# Patient Record
Sex: Female | Born: 1970 | Race: Black or African American | Hispanic: No | Marital: Single | Smoking: Never smoker
Health system: Southern US, Community
[De-identification: ages and names within clinical notes are randomized; demographics above are authoritative.]

## PROBLEM LIST (undated history)

## (undated) DIAGNOSIS — C801 Malignant (primary) neoplasm, unspecified: Secondary | ICD-10-CM

## (undated) DIAGNOSIS — Z8042 Family history of malignant neoplasm of prostate: Secondary | ICD-10-CM

## (undated) DIAGNOSIS — Z923 Personal history of irradiation: Secondary | ICD-10-CM

## (undated) DIAGNOSIS — Z800 Family history of malignant neoplasm of digestive organs: Secondary | ICD-10-CM

## (undated) DIAGNOSIS — Z803 Family history of malignant neoplasm of breast: Secondary | ICD-10-CM

## (undated) HISTORY — PX: OTHER SURGICAL HISTORY: SHX169

## (undated) HISTORY — DX: Family history of malignant neoplasm of prostate: Z80.42

## (undated) HISTORY — DX: Family history of malignant neoplasm of breast: Z80.3

## (undated) HISTORY — DX: Family history of malignant neoplasm of digestive organs: Z80.0

---

## 2006-08-26 ENCOUNTER — Encounter (INDEPENDENT_AMBULATORY_CARE_PROVIDER_SITE_OTHER): Payer: Self-pay | Admitting: Nurse Practitioner

## 2006-08-26 ENCOUNTER — Other Ambulatory Visit: Admission: RE | Admit: 2006-08-26 | Discharge: 2006-08-26 | Payer: Self-pay | Admitting: Family Medicine

## 2006-08-26 ENCOUNTER — Ambulatory Visit: Payer: Self-pay | Admitting: Internal Medicine

## 2006-08-26 LAB — CONVERTED CEMR LAB
ALT: 13 units/L (ref 0–35)
Bilirubin Urine: NEGATIVE
CO2: 21 meq/L (ref 19–32)
Calcium: 9.2 mg/dL (ref 8.4–10.5)
Chloride: 105 meq/L (ref 96–112)
Cholesterol: 167 mg/dL (ref 0–200)
Eosinophils Absolute: 0 10*3/uL (ref 0.0–0.7)
Eosinophils Relative: 1 % (ref 0–5)
GC Probe Amp, Genital: NEGATIVE
Glucose, Bld: 84 mg/dL (ref 70–99)
HCT: 39.9 % (ref 36.0–46.0)
HDL: 46 mg/dL (ref >39)
HIV-1 Genotyping, DNA Sequencing: NEGATIVE
Hemoglobin: 12.8 g/dL (ref 12.0–15.0)
Ketones, ur: NEGATIVE mg/dL
MCHC: 32.1 g/dL (ref 30.0–36.0)
MCV: 92.8 fL (ref 78.0–100.0)
Monocytes Absolute: 0.3 10*3/uL (ref 0.2–0.7)
Monocytes Relative: 5 % (ref 3–11)
Pap Smear: NEGATIVE
Platelets: 262 10*3/uL (ref 150–400)
Protein, ur: NEGATIVE mg/dL
RBC: 4.3 M/uL (ref 3.87–5.11)
RDW: 13.2 % (ref 11.5–14.0)
TSH: 1.672 microintl units/mL
TSH: 1.672 microintl units/mL (ref 0.350–5.50)
Total Bilirubin: 0.6 mg/dL (ref 0.3–1.2)
Total Protein: 7.6 g/dL (ref 6.0–8.3)
Urine Glucose: NEGATIVE mg/dL
VLDL: 13 mg/dL (ref 0–40)
WBC: 6 10*3/uL (ref 4.0–10.5)
pH: 6

## 2006-08-30 ENCOUNTER — Encounter: Payer: Self-pay | Admitting: Nurse Practitioner

## 2006-08-30 DIAGNOSIS — R630 Anorexia: Secondary | ICD-10-CM

## 2006-12-30 ENCOUNTER — Telehealth (INDEPENDENT_AMBULATORY_CARE_PROVIDER_SITE_OTHER): Payer: Self-pay | Admitting: *Deleted

## 2007-01-23 ENCOUNTER — Telehealth (INDEPENDENT_AMBULATORY_CARE_PROVIDER_SITE_OTHER): Payer: Self-pay | Admitting: Internal Medicine

## 2007-01-23 ENCOUNTER — Telehealth (INDEPENDENT_AMBULATORY_CARE_PROVIDER_SITE_OTHER): Payer: Self-pay | Admitting: Nurse Practitioner

## 2007-02-16 ENCOUNTER — Telehealth (INDEPENDENT_AMBULATORY_CARE_PROVIDER_SITE_OTHER): Payer: Self-pay | Admitting: Nurse Practitioner

## 2007-02-19 ENCOUNTER — Telehealth (INDEPENDENT_AMBULATORY_CARE_PROVIDER_SITE_OTHER): Payer: Self-pay | Admitting: *Deleted

## 2007-03-02 ENCOUNTER — Ambulatory Visit: Payer: Self-pay | Admitting: Nurse Practitioner

## 2007-03-02 DIAGNOSIS — B3731 Acute candidiasis of vulva and vagina: Secondary | ICD-10-CM | POA: Insufficient documentation

## 2007-03-02 DIAGNOSIS — N898 Other specified noninflammatory disorders of vagina: Secondary | ICD-10-CM | POA: Insufficient documentation

## 2007-03-02 DIAGNOSIS — B373 Candidiasis of vulva and vagina: Secondary | ICD-10-CM

## 2007-03-02 LAB — CONVERTED CEMR LAB
Chlamydia, Swab/Urine, PCR: NEGATIVE
GC Probe Amp, Urine: NEGATIVE
Glucose, Urine, Semiquant: NEGATIVE
Urobilinogen, UA: 1
WBC Urine, dipstick: NEGATIVE
pH: 6

## 2007-03-03 ENCOUNTER — Encounter (INDEPENDENT_AMBULATORY_CARE_PROVIDER_SITE_OTHER): Payer: Self-pay | Admitting: Nurse Practitioner

## 2007-03-04 ENCOUNTER — Encounter (INDEPENDENT_AMBULATORY_CARE_PROVIDER_SITE_OTHER): Payer: Self-pay | Admitting: Nurse Practitioner

## 2007-03-16 ENCOUNTER — Telehealth (INDEPENDENT_AMBULATORY_CARE_PROVIDER_SITE_OTHER): Payer: Self-pay | Admitting: *Deleted

## 2007-03-18 ENCOUNTER — Ambulatory Visit: Payer: Self-pay | Admitting: Nurse Practitioner

## 2007-03-19 ENCOUNTER — Telehealth (INDEPENDENT_AMBULATORY_CARE_PROVIDER_SITE_OTHER): Payer: Self-pay | Admitting: Nurse Practitioner

## 2007-05-19 ENCOUNTER — Ambulatory Visit: Payer: Self-pay | Admitting: Internal Medicine

## 2007-05-19 DIAGNOSIS — R609 Edema, unspecified: Secondary | ICD-10-CM

## 2007-05-19 LAB — CONVERTED CEMR LAB
Beta hcg, urine, semiquantitative: NEGATIVE
Specific Gravity, Urine: 1.02
Urobilinogen, UA: 1
WBC Urine, dipstick: NEGATIVE

## 2007-05-21 LAB — CONVERTED CEMR LAB
AST: 13 units/L (ref 0–37)
BUN: 10 mg/dL (ref 6–23)
Basophils Absolute: 0 10*3/uL (ref 0.0–0.1)
Basophils Relative: 0 % (ref 0–1)
CO2: 23 meq/L (ref 19–32)
Chloride: 104 meq/L (ref 96–112)
Eosinophils Absolute: 0.1 10*3/uL (ref 0.0–0.7)
Eosinophils Relative: 1 % (ref 0–5)
HCT: 37.1 % (ref 36.0–46.0)
Lymphocytes Relative: 50 % — ABNORMAL HIGH (ref 12–46)
MCHC: 34.2 g/dL (ref 30.0–36.0)
Monocytes Absolute: 0.4 10*3/uL (ref 0.1–1.0)
Platelets: 275 10*3/uL (ref 150–400)
Potassium: 4 meq/L (ref 3.5–5.3)
RDW: 13.2 % (ref 11.5–15.5)
Sodium: 138 meq/L (ref 135–145)
Total Bilirubin: 0.4 mg/dL (ref 0.3–1.2)

## 2007-07-16 ENCOUNTER — Telehealth (INDEPENDENT_AMBULATORY_CARE_PROVIDER_SITE_OTHER): Payer: Self-pay | Admitting: Nurse Practitioner

## 2007-07-21 ENCOUNTER — Ambulatory Visit: Payer: Self-pay | Admitting: Nurse Practitioner

## 2007-07-21 LAB — CONVERTED CEMR LAB
Bilirubin Urine: NEGATIVE
Nitrite: NEGATIVE
Specific Gravity, Urine: >=1.03
Urobilinogen, UA: 1
WBC Urine, dipstick: NEGATIVE

## 2007-09-01 ENCOUNTER — Telehealth (INDEPENDENT_AMBULATORY_CARE_PROVIDER_SITE_OTHER): Payer: Self-pay | Admitting: Nurse Practitioner

## 2007-10-07 ENCOUNTER — Telehealth (INDEPENDENT_AMBULATORY_CARE_PROVIDER_SITE_OTHER): Payer: Self-pay | Admitting: Nurse Practitioner

## 2007-10-22 ENCOUNTER — Ambulatory Visit: Payer: Self-pay | Admitting: Nurse Practitioner

## 2007-10-22 DIAGNOSIS — N644 Mastodynia: Secondary | ICD-10-CM

## 2007-10-22 DIAGNOSIS — R3129 Other microscopic hematuria: Secondary | ICD-10-CM

## 2007-10-22 DIAGNOSIS — F32A Depression, unspecified: Secondary | ICD-10-CM | POA: Insufficient documentation

## 2007-10-22 DIAGNOSIS — F329 Major depressive disorder, single episode, unspecified: Secondary | ICD-10-CM

## 2007-10-22 LAB — CONVERTED CEMR LAB
Bilirubin Urine: NEGATIVE
Protein, U semiquant: NEGATIVE
Specific Gravity, Urine: 1.01
Urobilinogen, UA: 1
WBC Urine, dipstick: NEGATIVE
pH: 6.5

## 2007-10-23 ENCOUNTER — Encounter (INDEPENDENT_AMBULATORY_CARE_PROVIDER_SITE_OTHER): Payer: Self-pay | Admitting: Internal Medicine

## 2007-11-12 ENCOUNTER — Telehealth (INDEPENDENT_AMBULATORY_CARE_PROVIDER_SITE_OTHER): Payer: Self-pay | Admitting: Nurse Practitioner

## 2008-01-01 ENCOUNTER — Telehealth (INDEPENDENT_AMBULATORY_CARE_PROVIDER_SITE_OTHER): Payer: Self-pay | Admitting: Nurse Practitioner

## 2008-01-04 ENCOUNTER — Ambulatory Visit: Payer: Self-pay | Admitting: Nurse Practitioner

## 2008-01-04 DIAGNOSIS — N3000 Acute cystitis without hematuria: Secondary | ICD-10-CM | POA: Insufficient documentation

## 2008-01-04 LAB — CONVERTED CEMR LAB
Bilirubin Urine: NEGATIVE
Nitrite: NEGATIVE
Specific Gravity, Urine: 1.02
Urobilinogen, UA: 0.2

## 2008-01-05 ENCOUNTER — Encounter (INDEPENDENT_AMBULATORY_CARE_PROVIDER_SITE_OTHER): Payer: Self-pay | Admitting: Nurse Practitioner

## 2008-01-13 ENCOUNTER — Emergency Department (HOSPITAL_COMMUNITY): Admission: EM | Admit: 2008-01-13 | Discharge: 2008-01-13 | Payer: Self-pay | Admitting: Emergency Medicine

## 2008-02-11 ENCOUNTER — Ambulatory Visit: Payer: Self-pay | Admitting: Nurse Practitioner

## 2008-02-11 DIAGNOSIS — L659 Nonscarring hair loss, unspecified: Secondary | ICD-10-CM | POA: Insufficient documentation

## 2008-02-15 ENCOUNTER — Encounter (INDEPENDENT_AMBULATORY_CARE_PROVIDER_SITE_OTHER): Payer: Self-pay | Admitting: Nurse Practitioner

## 2008-03-14 ENCOUNTER — Telehealth (INDEPENDENT_AMBULATORY_CARE_PROVIDER_SITE_OTHER): Payer: Self-pay | Admitting: Nurse Practitioner

## 2008-03-16 ENCOUNTER — Ambulatory Visit: Payer: Self-pay | Admitting: Nurse Practitioner

## 2008-03-16 ENCOUNTER — Other Ambulatory Visit: Admission: RE | Admit: 2008-03-16 | Discharge: 2008-03-16 | Payer: Self-pay | Admitting: Internal Medicine

## 2008-03-16 ENCOUNTER — Encounter (INDEPENDENT_AMBULATORY_CARE_PROVIDER_SITE_OTHER): Payer: Self-pay | Admitting: Nurse Practitioner

## 2008-03-16 DIAGNOSIS — M2140 Flat foot [pes planus] (acquired), unspecified foot: Secondary | ICD-10-CM | POA: Insufficient documentation

## 2008-03-16 LAB — CONVERTED CEMR LAB
Albumin: 4.2 g/dL (ref 3.5–5.2)
Alkaline Phosphatase: 50 units/L (ref 39–117)
Basophils Absolute: 0 10*3/uL (ref 0.0–0.1)
Chloride: 105 meq/L (ref 96–112)
Cholesterol: 186 mg/dL (ref 0–200)
Creatinine, Ser: 0.8 mg/dL (ref 0.40–1.20)
Eosinophils Absolute: 0.1 10*3/uL (ref 0.0–0.7)
Eosinophils Relative: 1 % (ref 0–5)
HCT: 37.3 % (ref 36.0–46.0)
Hemoglobin: 13 g/dL (ref 12.0–15.0)
KOH Prep: NEGATIVE
Ketones, urine, test strip: NEGATIVE
LDL Cholesterol: 119 mg/dL — ABNORMAL HIGH (ref 0–99)
Lymphocytes Relative: 43 % (ref 12–46)
Lymphs Abs: 2.6 10*3/uL (ref 0.7–4.0)
MCHC: 34.9 g/dL (ref 30.0–36.0)
MCV: 87.6 fL (ref 78.0–100.0)
Monocytes Relative: 7 % (ref 3–12)
Neutro Abs: 2.9 10*3/uL (ref 1.7–7.7)
Potassium: 4.4 meq/L (ref 3.5–5.3)
Protein, U semiquant: 30
Sodium: 140 meq/L (ref 135–145)
Specific Gravity, Urine: >=1.03
TSH: 4.84 microintl units/mL — ABNORMAL HIGH (ref 0.350–4.50)
Total Protein: 7.1 g/dL (ref 6.0–8.3)
Triglycerides: 76 mg/dL (ref <150)
pH: 5.5

## 2008-03-17 ENCOUNTER — Encounter (INDEPENDENT_AMBULATORY_CARE_PROVIDER_SITE_OTHER): Payer: Self-pay | Admitting: Nurse Practitioner

## 2008-04-08 ENCOUNTER — Telehealth (INDEPENDENT_AMBULATORY_CARE_PROVIDER_SITE_OTHER): Payer: Self-pay | Admitting: Nurse Practitioner

## 2008-05-24 ENCOUNTER — Ambulatory Visit: Payer: Self-pay | Admitting: Internal Medicine

## 2008-05-24 ENCOUNTER — Encounter (INDEPENDENT_AMBULATORY_CARE_PROVIDER_SITE_OTHER): Payer: Self-pay | Admitting: Nurse Practitioner

## 2008-05-24 LAB — CONVERTED CEMR LAB
KOH Prep: NEGATIVE
Ketones, urine, test strip: NEGATIVE
Nitrite: NEGATIVE
Protein, U semiquant: 100
WBC Urine, dipstick: NEGATIVE

## 2008-05-25 ENCOUNTER — Encounter (INDEPENDENT_AMBULATORY_CARE_PROVIDER_SITE_OTHER): Payer: Self-pay | Admitting: Nurse Practitioner

## 2008-05-25 ENCOUNTER — Inpatient Hospital Stay (HOSPITAL_COMMUNITY): Admission: AD | Admit: 2008-05-25 | Discharge: 2008-05-25 | Payer: Self-pay | Admitting: Family Medicine

## 2008-05-28 ENCOUNTER — Inpatient Hospital Stay (HOSPITAL_COMMUNITY): Admission: AD | Admit: 2008-05-28 | Discharge: 2008-05-28 | Payer: Self-pay | Admitting: Obstetrics

## 2008-06-10 ENCOUNTER — Inpatient Hospital Stay (HOSPITAL_COMMUNITY): Admission: RE | Admit: 2008-06-10 | Discharge: 2008-06-10 | Payer: Self-pay | Admitting: Obstetrics and Gynecology

## 2008-06-16 ENCOUNTER — Encounter (INDEPENDENT_AMBULATORY_CARE_PROVIDER_SITE_OTHER): Payer: Self-pay | Admitting: Nurse Practitioner

## 2008-06-17 ENCOUNTER — Inpatient Hospital Stay (HOSPITAL_COMMUNITY): Admission: AD | Admit: 2008-06-17 | Discharge: 2008-06-21 | Payer: Self-pay | Admitting: Obstetrics

## 2008-06-30 ENCOUNTER — Inpatient Hospital Stay (HOSPITAL_COMMUNITY): Admission: AD | Admit: 2008-06-30 | Discharge: 2008-06-30 | Payer: Self-pay | Admitting: Obstetrics

## 2008-07-11 ENCOUNTER — Inpatient Hospital Stay (HOSPITAL_COMMUNITY): Admission: AD | Admit: 2008-07-11 | Discharge: 2008-07-15 | Payer: Self-pay | Admitting: Obstetrics

## 2008-08-31 ENCOUNTER — Ambulatory Visit (HOSPITAL_COMMUNITY): Admission: RE | Admit: 2008-08-31 | Discharge: 2008-08-31 | Payer: Self-pay | Admitting: Obstetrics

## 2008-10-14 ENCOUNTER — Emergency Department (HOSPITAL_COMMUNITY): Admission: EM | Admit: 2008-10-14 | Discharge: 2008-10-15 | Payer: Self-pay | Admitting: Emergency Medicine

## 2008-10-24 ENCOUNTER — Inpatient Hospital Stay (HOSPITAL_COMMUNITY): Admission: AD | Admit: 2008-10-24 | Discharge: 2008-10-24 | Payer: Self-pay | Admitting: Obstetrics

## 2008-11-25 ENCOUNTER — Ambulatory Visit (HOSPITAL_COMMUNITY): Admission: RE | Admit: 2008-11-25 | Discharge: 2008-11-25 | Payer: Self-pay | Admitting: Obstetrics

## 2008-12-16 ENCOUNTER — Ambulatory Visit (HOSPITAL_COMMUNITY): Admission: RE | Admit: 2008-12-16 | Discharge: 2008-12-16 | Payer: Self-pay | Admitting: Obstetrics

## 2008-12-22 ENCOUNTER — Inpatient Hospital Stay (HOSPITAL_COMMUNITY): Admission: AD | Admit: 2008-12-22 | Discharge: 2008-12-22 | Payer: Self-pay | Admitting: Obstetrics

## 2008-12-22 ENCOUNTER — Ambulatory Visit: Payer: Self-pay | Admitting: Advanced Practice Midwife

## 2009-01-05 ENCOUNTER — Emergency Department (HOSPITAL_COMMUNITY): Admission: EM | Admit: 2009-01-05 | Discharge: 2009-01-05 | Payer: Self-pay | Admitting: Emergency Medicine

## 2009-01-25 ENCOUNTER — Inpatient Hospital Stay (HOSPITAL_COMMUNITY): Admission: RE | Admit: 2009-01-25 | Discharge: 2009-01-28 | Payer: Self-pay | Admitting: Obstetrics

## 2009-01-25 ENCOUNTER — Encounter (INDEPENDENT_AMBULATORY_CARE_PROVIDER_SITE_OTHER): Payer: Self-pay | Admitting: Obstetrics

## 2009-02-05 ENCOUNTER — Inpatient Hospital Stay (HOSPITAL_COMMUNITY): Admission: AD | Admit: 2009-02-05 | Discharge: 2009-02-05 | Payer: Self-pay | Admitting: Obstetrics

## 2009-03-13 ENCOUNTER — Encounter (INDEPENDENT_AMBULATORY_CARE_PROVIDER_SITE_OTHER): Payer: Self-pay | Admitting: Nurse Practitioner

## 2009-03-20 LAB — CONVERTED CEMR LAB

## 2009-03-21 ENCOUNTER — Ambulatory Visit (HOSPITAL_COMMUNITY): Admission: RE | Admit: 2009-03-21 | Discharge: 2009-03-21 | Payer: Self-pay | Admitting: Obstetrics

## 2009-03-23 ENCOUNTER — Telehealth (INDEPENDENT_AMBULATORY_CARE_PROVIDER_SITE_OTHER): Payer: Self-pay | Admitting: *Deleted

## 2009-03-24 ENCOUNTER — Ambulatory Visit: Payer: Self-pay | Admitting: Nurse Practitioner

## 2009-03-24 DIAGNOSIS — M79609 Pain in unspecified limb: Secondary | ICD-10-CM

## 2009-04-26 ENCOUNTER — Telehealth (INDEPENDENT_AMBULATORY_CARE_PROVIDER_SITE_OTHER): Payer: Self-pay | Admitting: *Deleted

## 2009-07-24 ENCOUNTER — Emergency Department (HOSPITAL_COMMUNITY): Admission: EM | Admit: 2009-07-24 | Discharge: 2009-07-24 | Payer: Self-pay | Admitting: Emergency Medicine

## 2009-09-11 ENCOUNTER — Ambulatory Visit: Payer: Self-pay | Admitting: Nurse Practitioner

## 2009-09-11 LAB — CONVERTED CEMR LAB: KOH Prep: NEGATIVE

## 2010-01-01 ENCOUNTER — Encounter (INDEPENDENT_AMBULATORY_CARE_PROVIDER_SITE_OTHER): Payer: Self-pay | Admitting: Nurse Practitioner

## 2010-01-01 ENCOUNTER — Ambulatory Visit: Payer: Self-pay | Admitting: Nurse Practitioner

## 2010-01-01 DIAGNOSIS — N926 Irregular menstruation, unspecified: Secondary | ICD-10-CM | POA: Insufficient documentation

## 2010-01-01 LAB — CONVERTED CEMR LAB
Basophils Absolute: 0 10*3/uL (ref 0.0–0.1)
Basophils Relative: 0 % (ref 0–1)
Eosinophils Absolute: 0.1 10*3/uL (ref 0.0–0.7)
Eosinophils Relative: 1 % (ref 0–5)
Hemoglobin: 13.6 g/dL (ref 12.0–15.0)
MCHC: 34.4 g/dL (ref 30.0–36.0)
Monocytes Absolute: 0.3 10*3/uL (ref 0.1–1.0)
Neutro Abs: 3.9 10*3/uL (ref 1.7–7.7)
RDW: 12.6 % (ref 11.5–15.5)

## 2010-01-02 ENCOUNTER — Encounter (INDEPENDENT_AMBULATORY_CARE_PROVIDER_SITE_OTHER): Payer: Self-pay | Admitting: Nurse Practitioner

## 2010-01-16 ENCOUNTER — Telehealth (INDEPENDENT_AMBULATORY_CARE_PROVIDER_SITE_OTHER): Payer: Self-pay | Admitting: Nurse Practitioner

## 2010-02-12 ENCOUNTER — Encounter: Payer: Self-pay | Admitting: Obstetrics and Gynecology

## 2010-02-20 NOTE — Progress Notes (Signed)
Summary: PATIENT WANTS REF. TO FOOT DR STILL  Phone Note Call from Patient Call back at Home Phone [phone]   Summary of Call: MARTIN PT. MS Krontz CALLED TODAY TO SEE IF SOMEONE HAD CANCELLED IN [facility] SCHEDULE FOR TODAY, AND I TOLD HER NO. SHE TELLS ME THAT ON MONDAY 03/07 SHE HAS ORIENTATION FOR A JOB AT 2, SO I OFFERED HER AN APPOINTMENT AT 9:30 ON THE SAME DAY. MS Aeschliman SAYS THAT SHE CAN'T COME BECAUSE SHE HAD ALREADY ARRANGED CHILD CARE FOR HER BABY. I MENTIONED TO HER THAT SHE COULD BRING THE BABY, AND SHE STILL HAS TIME TO GET TO HER 2 O'CLOCK APPT. SO SHE REFUSED THE APPT. AT 9:30, BUT JUST KEPT SAYING WHY CAN'T WE JUST CAN'T  GIVE HER REFERRAL TO A FOOT DR. I EXPLAINED TO HER AS DID REGINA ABOUT 2 WEEKS AGO THE [facility] HADN'T SEEN HER IN A YEAR BECAUSE SHE WAS PREGNANT AND NEEDS TO BE SEEN BY HER PRIMARY PROVIDER TO MAKE SURE EVERYTHING IS OK. Initial call taken by: Leodis Rains,  March 23, 2009 2:53 PM  Follow-up for Phone Call        PT WAS SCHEDULED AN APPT.Marland KitchenMikey College CMA  March 24, 2009 9:08 AM    Additional Follow-up for Phone Call Additional follow up Details #1::        pt seen in clinic by primary care provider. Additional Follow-up by: Mikey College CMA,  March 31, 2009 2:22 PM

## 2010-02-20 NOTE — Progress Notes (Signed)
Summary: WEIGHT GAIN MED NOT WORKING  Phone Note Call from Patient Call back at Home Phone [phone]   Reason for Call: Refill Medication Summary of Call: MARTIN PT. MS Pelissier CALLED TO LET YOU KNOW THAT THE WEIGHT GAIN MEDICATION YOU PRESCRIBED AT HER LAST VISIT IS NOT HELPING, SHE SAYS THAT SHE CAN TELL IN HER PANTS THAT SHE IS ACTUALLY LOOSING WEIGHT, SO SHE CALLED [facility] [address] AND FOUND OUT THAT WHAT SHE USE TO TAKE ( CYPROPHEPTADINE 4mg  ) IS COVERED BY MEDICAID, SO SHE WOULD LIKE ANOTHER REFILL ON THAT. [facility]. Initial call taken by: Leodis Rains,  April 26, 2009 11:32 AM  Follow-up for Phone Call        forward to N.l Daphine Deutscher, FNP Follow-up by: Levon Hedger,  April 26, 2009 12:25 PM  Additional Follow-up for Phone Call Additional follow up Details #1::        pt seems to have an obsession with weight gain. she is NOT going to be able to take medications to help her gain weight for the rest of her life. She can take medications but if she is NOT eating at least 2000 calories per day then she is not going to keep and maintain weight.  So she need to look at increasing her calorie intake. [facility] Additional Follow-up by: Lehman Prom FNP,  April 26, 2009 12:45 PM    Additional Follow-up for Phone Call Additional follow up Details #2::    Levon Hedger  April 28, 2009 4:47 PM Left message on machine for pt to return call to the office. pt aware.cm Follow-up by: Mikey College CMA,  May 04, 2009 10:03 AM  New/Updated Medications: CYPROHEPTADINE HCL 4 MG TABS (CYPROHEPTADINE HCL) One tablet by mouth three times a day Prescriptions: CYPROHEPTADINE HCL 4 MG TABS (CYPROHEPTADINE HCL) One tablet by mouth three times a day  #90 x 3   Entered and Authorized by:   Lehman Prom FNP   Signed by:   Lehman Prom FNP on 04/26/2009   Method used:   [facility] to        [facility]  [address] [id number]       [address]       [place], [place]  [address]       Ph: [phone] or [phone]       Fax: [phone]   RxID:   [id number]

## 2010-02-20 NOTE — Letter (Signed)
Summary: REQUESTING RECORDS FOR SELF//READY FOR PICK UP  REQUESTING RECORDS FOR SELF//READY FOR PICK UP   Imported By: Arta Bruce 03/13/2009 11:41:18  _____________________________________________________________________  External Attachment:    Type:   Image     Comment:   External Document

## 2010-02-20 NOTE — Letter (Signed)
Summary: REFERRAL//PODIATRY  REFERRAL//PODIATRY   Imported By: Arta Bruce 05/19/2009 11:03:04  _____________________________________________________________________  External Attachment:    Type:   Image     Comment:   External Document

## 2010-02-20 NOTE — Assessment & Plan Note (Signed)
Summary: Foot pain   Vital Signs:  Patient profile:   40 year old female LMP:     03/15/2009 Weight:      150.9 pounds BMI:     23.90 BSA:     1.79 Temp:     97.8 degrees F oral Pulse rate:   78 / minute Pulse rhythm:   regular Resp:     20 per minute BP sitting:   117 / 78  (left arm) Cuff size:   regular  Vitals Entered By: Levon Hedger (March 24, 2009 11:30 AM) CC: pain in feet...weight medication Is Patient Diabetic? No Pain Assessment Patient in pain? no       Does patient need assistance? Functional Status Self care Ambulation Normal LMP (date): 03/15/2009 LMP - Character: normal     Enter LMP: 03/15/2009 Last PAP Result done by Dr. Gaynell Face   CC:  pain in feet...weight medication.  History of Present Illness:  Pt into the office for follow up. Pt has had baby since her last visit in this office.  Adjusting as expected. Her baby is 25 months old (with pt today in office) Pt breast feed for 2 months then she quit. pt has not returned to work yet.  Foot problem -  Ongoing problems with both feet. Pt has been to [facility] in [facility] past and she presents today with a shoe insert that was made for her in 2008.  She also purchased some shoes that were over $100 and she removed the inserts and put in her current shoes.   Pt is not able to stand up for extended periods of time. Pain has gotten worse following her pregnancy. Pain started about 2-3 years ago when she fractured a bone in her left foot and she was told that it did not heal properly.  She was shifting her gait from side to side in an attempt to limit the pain. Denies any swelling no ulcers, lacerations, callous, bunions or corns  Allergies (verified): No Known Drug Allergies  Review of Systems General:  Denies fever and loss of appetite; Pt does eat and has even used ensure supplements but has difficulty maintaining her desired weight.  no longer breast feeding. previously taking  appetite stimulants. CV:  Denies chest pain or discomfort. Resp:  Denies cough. GI:  Denies abdominal pain, nausea, and vomiting. MS:  bilteral foot pain.  Physical Exam  General:  alert.   Head:  normocephalic.   Msk:  pes planus bilaterally no callous or ulcers Neurologic:  alert & oriented X3.   Skin:  color normal.   Psych:  Oriented X3.     Impression & Recommendations:  Problem # 1:  FOOT PAIN (ICD-729.5)  advised pt to take mobic for feet will [facility] pt will most likely need to be refitted for some supports may need x-rays of feet to check for shifting of bones  Orders: Podiatry Referral (Podiatry)  Problem # 2:  PES PLANUS (ICD-734)  history  Orders: Podiatry Referral (Podiatry)  Problem # 3:  LOSS OF APPETITE (ICD-783.0) advised pt to try nutrition supplements will order megace  Problem # 4:  DEPRESSION (ICD-311) given her history advised pt to pay close attention to her mood post partum depression is always a concern in those previously dx with depression The following medications were removed from the medication list:    Remeron 15 Mg Tabs (Mirtazapine) .Marland Kitchen... 1 tablet by mouth nightly for  mood/appetite  Complete Medication List: 1)  Megace Oral 40 Mg/ml Susp (Megestrol acetate) .Marland Kitchen.. 10ml by mouth two times a day for appetite 2)  Meloxicam 15 Mg Tabs (Meloxicam) .... One tablet by mouth daily for foot pain  Patient Instructions: 1)  Take your medications as ordered 2)  Follow up as needed 3)  Your referral will be sent to [facility] Prescriptions: MEGACE ORAL 40 MG/ML SUSP (MEGESTROL ACETATE) 10ml by mouth two times a day for appetite  #1 month qs x 5   Entered and Authorized by:   Lehman Prom FNP   Signed by:   Lehman Prom FNP on 03/24/2009   Method used:   Print then Give to Patient   RxID:   [id number] MELOXICAM 15 MG TABS (MELOXICAM) One tablet by mouth daily for foot pain  #30 x 5   Entered  and Authorized by:   Lehman Prom FNP   Signed by:   Lehman Prom FNP on 03/24/2009   Method used:   Print then Give to Patient   RxID:   [id number]

## 2010-02-20 NOTE — Assessment & Plan Note (Signed)
Summary: Vaginal odor   Vital Signs:  Patient profile:   40 year old female LMP:     08/2009 Weight:      161.9 pounds BMI:     25.64 Temp:     98.3 degrees F oral Pulse rate:   77 / minute Pulse rhythm:   regular Resp:     20 per minute BP sitting:   106 / 70  (left arm) Cuff size:   regular  Vitals Entered By: Levon Hedger (September 11, 2009 3:43 PM) CC: has an bad vaginal odor, no discharge, burning but a constant smell...needs refills on weight medication....headaches that hurt in the back of her head  Is Patient Diabetic? No Pain Assessment Patient in pain? no       Does patient need assistance? Functional Status Self care Ambulation Normal LMP (date): 08/2009 LMP - Character: normal     Enter LMP: 08/2009 Last PAP Result done by Dr. Gaynell Face   CC:  has an bad vaginal odor, no discharge, and burning but a constant smell...needs refills on weight medication....headaches that hurt in the back of her head .  History of Present Illness:  Pt into the office with complaints of vaginal odor -dysuria -abdominal pain -vaginal discharge or itching denies any douching  -tobacco  -ETOH S/p tubal ligation 01/2009; infant child is now crawling.  She is not breast feeding Menses is regular following the birth of baby.  Weight - always seems to be an issue with pt. up 11 pounds since last visit.   Allergies: No Known Drug Allergies  Past History:  Past Surgical History: tubal ligation 01/2009  Review of Systems CV:  Denies chest pain or discomfort. Resp:  Denies cough. GI:  Denies abdominal pain, diarrhea, nausea, and vomiting. GU:  Denies discharge and urinary frequency; vaginal odor.  Physical Exam  General:  alert.   Lungs:  normal breath sounds.   Heart:  normal rate and regular rhythm.   Genitalia:  self wet prep Msk:  normal ROM.   Neurologic:  alert & oriented X3.   Skin:  color normal.   Psych:  Oriented X3.     Impression &  Recommendations:  Problem # 1:  VAGINAL DISCHARGE (ICD-623.5) wet prep normal today reviewed hygiene habits with pt  Other Orders: KOH/ WET [place] [phone])  Patient Instructions: 1)  No bacteria seen on wet prep. 2)  Be sure to wipe from front to back 3)  no douching  4)  eat some yogurt  5)  Schedule an appointment for fasting labs - need at least 8 hours of fasting status   Laboratory Results  Date/Time Received: September 11, 2009 5:02 PM   [employer] Source: vaginal WBC/hpf: 1-5 Bacteria/hpf: rare Clue cells/hpf: none Yeast/hpf: none Wet [place] KOH: Negative Trichomonas/hpf: none

## 2010-02-21 ENCOUNTER — Ambulatory Visit: Payer: Medicaid Other | Admitting: Physical Therapy

## 2010-02-22 NOTE — Progress Notes (Signed)
Summary: Vaginal bump and discharge  Phone Note Call from Patient   Summary of Call: Had period for a whole month, saw Jesse Fall, stopped two days after visit.     Had unprotected sex with one person about 10 days ago - symptoms started after sexual relations.  This Saturday, she noticed a white, milky discharge, some odor in the beginning, but has decreased and has a very small bump on the left side external labia, painful and itching.  Denies drainage from raised area.  Put some vagisil cream which helped itching, but bump didn't change.  Bump burns when urinating.  Denies unusual vaginal bleeding or hematuria.   Denies other symptoms.  Has been trying to sleep without underclothes.  Denies change of soap or fragrance products, either washing or detergent. Stated she tried epsom salts on it and raised area has not gone away -- very uncomfortable.  Initial call taken by: Dutch Quint RN,  January 16, 2010 10:44 AM  Follow-up for Phone Call        advise pt to apply heat to the affected area may be either a cyst or blocked duct in either case the warm heat will help to express the drainage avoid any irritation to vaginal area as discussed earlier - no body wash, scented tissue, pads, etc Follow-up by: Lehman Prom FNP,  January 16, 2010 12:09 PM  Additional Follow-up for Phone Call Additional follow up Details #1::        Advised of provider's response and instructions -- verbalized understanding and agreement.  Advised to call back in a few days if worsens or persists.  Dutch Quint RN  January 16, 2010 12:15 PM

## 2010-02-22 NOTE — Letter (Signed)
Summary: *HSN Results Follow up  [facility]  [address] [place], [place] [address]   Phone: [phone]  Fax: [phone]      01/02/2010   Rhonda Rocha [address] [place], [place]  [address]   Dear  Ms. Gonzella Lex,                            ____S.Drinkard,FNP   ____D. Gore,FNP       ____B. McPherson,MD   ____V. Rankins,MD    ____E. Mulberry,MD    __X__N. Daphine Deutscher, FNP  ____D. Reche Dixon, MD    ____K. Philipp Deputy, MD    ____Other     This letter is to inform you that your recent test(s):  _______Pap Smear    ____X___Lab Test     _______X-ray    ___X____ is within acceptable limits  _______ requires a medication change  _______ requires a follow-up lab visit  _______ requires a follow-up visit with your provider   Comments: Labs done during recent office visit are normal.        _________________________________________________________ If you have any questions, please contact our office [phone].                    Sincerely,    Lehman Prom FNP [facility]

## 2010-02-22 NOTE — Assessment & Plan Note (Signed)
Summary: Irregular Menses   Vital Signs:  Patient profile:   40 year old female Menstrual status:  irregular LMP:     12/10/2009 Weight:      167.56 pounds BMI:     26.54 Temp:     97.4 degrees F oral Pulse rate:   70 / minute Pulse rhythm:   regular Resp:     16 per minute BP sitting:   102 / 78  (left arm) Cuff size:   regular  Vitals Entered By: Hale Drone CMA (January 01, 2010 4:51 PM)  Nutrition Counseling: Patient's BMI is greater than 25 and therefore counseled on weight management options. CC: Menstrual period concerns. Last period was in 12/10/09. First 3 days she was bleeding, but a little less than usual. Since then, has been spotting. Did notice a foul odor. Was cramping last week that lasted for 1 day. Today, feeling fatigued. Denies vomiting, nauseas and diarrhea. Did feel a little warm yersterday night and today in the morning. , Depression Is Patient Diabetic? No Pain Assessment Patient in pain? no       Does patient need assistance? Functional Status Self care Ambulation Normal LMP (date): 12/10/2009 LMP - Character: normal     Menstrual flow (days): 5 Menstrual Status irregular Enter LMP: 12/10/2009 Last PAP Result done by Dr. Gaynell Face   CC:  Menstrual period concerns. Last period was in 12/10/09. First 3 days she was bleeding, but a little less than usual. Since then, has been spotting. Did notice a foul odor. Was cramping last week that lasted for 1 day. Today, feeling fatigued. Denies vomiting, nauseas and diarrhea. Did feel a little warm yersterday night and today in the morning. , and Depression.  History of Present Illness:  Pt into the office for f/u for spotting. Spotting started last month. S/p tubal ligation after birth of last child. Menses started 12/10/2009 and lasted for 5 days then she has been spotting every since. +odor for which she used a douch (although she has been instructed not to do so) -cramps  Denies any ETOH or tobacco  use No new meds - OTC  Foot pain - pt is preparing for surgery. The patient denies that she feels like life is not worth living, denies that she wishes that she were dead, and denies that she has thought about ending her life.         Habits & Providers  Alcohol-Tobacco-Diet     Alcohol drinks/day: 0     Tobacco Status: quit  Exercise-Depression-Behavior     Have you felt down or hopeless? no     Have you felt little pleasure in things? no     Depression Counseling: not indicated; screening negative for depression     Drug Use: no     Seat Belt Use: 100     Sun Exposure: occasionally  Current Medications (verified): 1)  None  Allergies (verified): No Known Drug Allergies  Review of Systems General:  Denies fever. CV:  Denies chest pain or discomfort. Resp:  Denies cough. GI:  Denies abdominal pain, nausea, and vomiting.  Physical Exam  General:  alert.   Head:  normocephalic.   Lungs:  normal breath sounds.   Heart:  normal rate and regular rhythm.   Abdomen:  normal bowel sounds.   Msk:  presents today with slippers Neurologic:  alert & oriented X3.     Impression & Recommendations:  Problem # 1:  IRREGULAR MENSES (ICD-626.4) will check labs advised pt  of causes for menstrual Orders: T-TSH ([address]) T-CBC w/Diff ([id number])  Problem # 2:  FOOT PAIN (ICD-729.5) pt has surgery pending by podiatry  Patient Instructions: 1)  Your labs will be checked today for cause of irregular period. 2)  This may be hormone related.  Although aggravating monitor periods for the next 2-3 months.  Hopefully the spotting with stop. 3)  Other causes of irregular periods are:  4)  Weight gain or loss 5)  Stress 6)  If problem persists over the next month you may need an ultrasound. 7)  Follow up as needed   Orders Added: 1)  Est. Patient Level III [[address]] 2)  T-TSH [[id number]] 3)  T-CBC w/Diff [[id number]]   Not Administered:    Influenza Vaccine not given  due to: declined

## 2010-02-23 ENCOUNTER — Ambulatory Visit: Payer: Medicaid Other | Attending: Podiatry | Admitting: Physical Therapy

## 2010-02-23 DIAGNOSIS — R262 Difficulty in walking, not elsewhere classified: Secondary | ICD-10-CM | POA: Insufficient documentation

## 2010-02-23 DIAGNOSIS — IMO0001 Reserved for inherently not codable concepts without codable children: Secondary | ICD-10-CM | POA: Insufficient documentation

## 2010-02-23 DIAGNOSIS — M25579 Pain in unspecified ankle and joints of unspecified foot: Secondary | ICD-10-CM | POA: Insufficient documentation

## 2010-02-23 DIAGNOSIS — M25673 Stiffness of unspecified ankle, not elsewhere classified: Secondary | ICD-10-CM | POA: Insufficient documentation

## 2010-02-23 DIAGNOSIS — M25676 Stiffness of unspecified foot, not elsewhere classified: Secondary | ICD-10-CM | POA: Insufficient documentation

## 2010-02-28 ENCOUNTER — Ambulatory Visit: Payer: Medicaid Other | Admitting: Physical Therapy

## 2010-03-07 ENCOUNTER — Encounter: Payer: Medicaid Other | Admitting: Physical Therapy

## 2010-03-07 ENCOUNTER — Encounter: Payer: Self-pay | Admitting: Physical Therapy

## 2010-03-07 ENCOUNTER — Ambulatory Visit: Payer: Medicaid Other | Admitting: Physical Therapy

## 2010-03-14 ENCOUNTER — Encounter: Payer: Medicaid Other | Admitting: Physical Therapy

## 2010-03-21 ENCOUNTER — Encounter: Payer: Self-pay | Admitting: Physical Therapy

## 2010-04-08 LAB — CBC
Hemoglobin: 10.1 g/dL — ABNORMAL LOW (ref 12.0–15.0)
Hemoglobin: 10.9 g/dL — ABNORMAL LOW (ref 12.0–15.0)
MCHC: 33.6 g/dL (ref 30.0–36.0)
MCHC: 33.6 g/dL (ref 30.0–36.0)
MCV: 93.7 fL (ref 78.0–100.0)
Platelets: 163 10*3/uL (ref 150–400)
RBC: 3.2 MIL/uL — ABNORMAL LOW (ref 3.87–5.11)
RDW: 13.4 % (ref 11.5–15.5)
RDW: 12.5 % (ref 11.5–15.5)

## 2010-04-08 LAB — DIFFERENTIAL
Basophils Absolute: 0 10*3/uL (ref 0.0–0.1)
Lymphocytes Relative: 28 % (ref 12–46)
Monocytes Absolute: 0.3 10*3/uL (ref 0.1–1.0)
Neutro Abs: 4.1 10*3/uL (ref 1.7–7.7)
Neutrophils Relative %: 65 % (ref 43–77)

## 2010-04-08 LAB — URINALYSIS, ROUTINE W REFLEX MICROSCOPIC
Glucose, UA: NEGATIVE mg/dL
Ketones, ur: NEGATIVE mg/dL
Protein, ur: NEGATIVE mg/dL

## 2010-04-08 LAB — URINE MICROSCOPIC-ADD ON

## 2010-04-08 LAB — URINE CULTURE
Colony Count: NO GROWTH
Culture: NO GROWTH

## 2010-04-23 LAB — RAPID URINE DRUG SCREEN, HOSP PERFORMED
Barbiturates: NOT DETECTED
Cocaine: NOT DETECTED
Opiates: NOT DETECTED
Tetrahydrocannabinol: NOT DETECTED

## 2010-04-23 LAB — DIFFERENTIAL
Eosinophils Absolute: 0 10*3/uL (ref 0.0–0.7)
Eosinophils Relative: 0 % (ref 0–5)
Lymphocytes Relative: 16 % (ref 12–46)
Lymphs Abs: 1.5 10*3/uL (ref 0.7–4.0)
Monocytes Relative: 5 % (ref 3–12)

## 2010-04-23 LAB — POCT CARDIAC MARKERS
CKMB, poc: <1 ng/mL — ABNORMAL LOW (ref 1.0–8.0)
CKMB, poc: <1 ng/mL — ABNORMAL LOW (ref 1.0–8.0)
Myoglobin, poc: 22 ng/mL (ref 12–200)
Myoglobin, poc: 27.7 ng/mL (ref 12–200)

## 2010-04-23 LAB — TSH: TSH: 1.278 u[IU]/mL (ref 0.350–4.500)

## 2010-04-23 LAB — CBC
MCHC: 34.3 g/dL (ref 30.0–36.0)
Platelets: 178 10*3/uL (ref 150–400)
RBC: 3.56 MIL/uL — ABNORMAL LOW (ref 3.87–5.11)
RDW: 13.3 % (ref 11.5–15.5)

## 2010-04-23 LAB — COMPREHENSIVE METABOLIC PANEL
ALT: 15 U/L (ref 0–35)
AST: 22 U/L (ref 0–37)
Albumin: 2.2 g/dL — ABNORMAL LOW (ref 3.5–5.2)
Calcium: 8.4 mg/dL (ref 8.4–10.5)
Creatinine, Ser: 0.61 mg/dL (ref 0.4–1.2)
GFR calc Af Amer: >60 mL/min (ref >60)
Sodium: 135 mEq/L (ref 135–145)
Total Protein: 5.5 g/dL — ABNORMAL LOW (ref 6.0–8.3)

## 2010-04-23 LAB — PROTIME-INR: INR: 0.97 (ref 0.00–1.49)

## 2010-04-23 LAB — APTT: aPTT: 28 seconds (ref 24–37)

## 2010-04-26 LAB — URINE MICROSCOPIC-ADD ON

## 2010-04-26 LAB — HERPES SIMPLEX VIRUS CULTURE: Culture: NOT DETECTED

## 2010-04-26 LAB — URINALYSIS, ROUTINE W REFLEX MICROSCOPIC
Bilirubin Urine: NEGATIVE
Ketones, ur: NEGATIVE mg/dL
Nitrite: NEGATIVE
Specific Gravity, Urine: 1.01 (ref 1.005–1.030)
pH: 6.5 (ref 5.0–8.0)

## 2010-04-26 LAB — WET PREP, GENITAL
Trich, Wet Prep: NONE SEEN
Yeast Wet Prep HPF POC: NONE SEEN

## 2010-04-27 ENCOUNTER — Emergency Department (HOSPITAL_COMMUNITY)
Admission: EM | Admit: 2010-04-27 | Discharge: 2010-04-28 | Disposition: A | Discharge status: Home / Self Care | Payer: Medicaid Other | Attending: Emergency Medicine | Admitting: Emergency Medicine

## 2010-04-27 DIAGNOSIS — R51 Headache: Secondary | ICD-10-CM | POA: Insufficient documentation

## 2010-04-28 LAB — CSF CELL COUNT WITH DIFFERENTIAL
RBC Count, CSF: 1310 /mm3 — ABNORMAL HIGH
RBC Count, CSF: 6 /mm3 — ABNORMAL HIGH
Tube #: 1
WBC, CSF: 1 /mm3 (ref 0–5)
WBC, CSF: 0 /mm3 (ref 0–5)

## 2010-04-28 LAB — DIFFERENTIAL
Basophils Absolute: 0 10*3/uL (ref 0.0–0.1)
Eosinophils Relative: 0 % (ref 0–5)
Lymphocytes Relative: 13 % (ref 12–46)
Lymphs Abs: 2.1 10*3/uL (ref 0.7–4.0)
Neutro Abs: 13.3 10*3/uL — ABNORMAL HIGH (ref 1.7–7.7)
Neutrophils Relative %: 82 % — ABNORMAL HIGH (ref 43–77)

## 2010-04-28 LAB — CBC
HCT: 38.9 % (ref 36.0–46.0)
RBC: 4.41 MIL/uL (ref 3.87–5.11)
RDW: 12.6 % (ref 11.5–15.5)
WBC: 16.2 10*3/uL — ABNORMAL HIGH (ref 4.0–10.5)

## 2010-04-28 LAB — URINALYSIS, ROUTINE W REFLEX MICROSCOPIC
Bilirubin Urine: NEGATIVE
Glucose, UA: NEGATIVE mg/dL
Ketones, ur: NEGATIVE mg/dL
Protein, ur: NEGATIVE mg/dL
pH: 6 (ref 5.0–8.0)

## 2010-04-28 LAB — BASIC METABOLIC PANEL
Calcium: 8.9 mg/dL (ref 8.4–10.5)
GFR calc Af Amer: >60 mL/min (ref >60)
GFR calc non Af Amer: >60 mL/min (ref >60)
Glucose, Bld: 122 mg/dL — ABNORMAL HIGH (ref 70–99)
Potassium: 3.9 mEq/L (ref 3.5–5.1)
Sodium: 135 mEq/L (ref 135–145)

## 2010-04-28 LAB — POCT PREGNANCY, URINE: Preg Test, Ur: NEGATIVE

## 2010-04-29 ENCOUNTER — Emergency Department (HOSPITAL_COMMUNITY): Payer: Medicaid Other

## 2010-04-29 ENCOUNTER — Emergency Department (HOSPITAL_COMMUNITY)
Admission: EM | Admit: 2010-04-29 | Discharge: 2010-04-29 | Discharge status: Against Medical Advice | Payer: Medicaid Other | Attending: Emergency Medicine | Admitting: Emergency Medicine

## 2010-04-29 ENCOUNTER — Emergency Department (HOSPITAL_COMMUNITY)
Admission: EM | Admit: 2010-04-29 | Discharge: 2010-04-29 | Disposition: A | Discharge status: Home / Self Care | Payer: Medicaid Other | Attending: Emergency Medicine | Admitting: Emergency Medicine

## 2010-04-29 DIAGNOSIS — Y844 Aspiration of fluid as the cause of abnormal reaction of the patient, or of later complication, without mention of misadventure at the time of the procedure: Secondary | ICD-10-CM | POA: Insufficient documentation

## 2010-04-29 DIAGNOSIS — G971 Other reaction to spinal and lumbar puncture: Secondary | ICD-10-CM | POA: Insufficient documentation

## 2010-04-29 DIAGNOSIS — R51 Headache: Secondary | ICD-10-CM | POA: Insufficient documentation

## 2010-04-29 DIAGNOSIS — M542 Cervicalgia: Secondary | ICD-10-CM | POA: Insufficient documentation

## 2010-04-30 LAB — URINALYSIS, ROUTINE W REFLEX MICROSCOPIC
Bilirubin Urine: NEGATIVE
Glucose, UA: NEGATIVE mg/dL
Ketones, ur: >80 mg/dL — AB
Leukocytes, UA: NEGATIVE
Protein, ur: 30 mg/dL — AB
pH: 6 (ref 5.0–8.0)

## 2010-04-30 LAB — COMPREHENSIVE METABOLIC PANEL
ALT: 14 U/L (ref 0–35)
AST: 20 U/L (ref 0–37)
Albumin: 4 g/dL (ref 3.5–5.2)
Albumin: 4 g/dL (ref 3.5–5.2)
Alkaline Phosphatase: 43 U/L (ref 39–117)
Alkaline Phosphatase: 42 U/L (ref 39–117)
BUN: 7 mg/dL (ref 6–23)
Chloride: 105 mEq/L (ref 96–112)
Chloride: 105 mEq/L (ref 96–112)
GFR calc Af Amer: >60 mL/min (ref >60)
Potassium: 3.5 mEq/L (ref 3.5–5.1)
Potassium: 4.2 mEq/L (ref 3.5–5.1)
Sodium: 135 mEq/L (ref 135–145)
Total Protein: 7.3 g/dL (ref 6.0–8.3)
Total Protein: 7 g/dL (ref 6.0–8.3)

## 2010-04-30 LAB — CBC
Hemoglobin: 13.4 g/dL (ref 12.0–15.0)
Platelets: 237 10*3/uL (ref 150–400)
RDW: 13.3 % (ref 11.5–15.5)
WBC: 8.3 10*3/uL (ref 4.0–10.5)

## 2010-04-30 LAB — GLUCOSE, CAPILLARY
Glucose-Capillary: 113 mg/dL — ABNORMAL HIGH (ref 70–99)
Glucose-Capillary: 130 mg/dL — ABNORMAL HIGH (ref 70–99)
Glucose-Capillary: 88 mg/dL (ref 70–99)

## 2010-04-30 LAB — URINE MICROSCOPIC-ADD ON

## 2010-05-01 LAB — WET PREP, GENITAL: Clue Cells Wet Prep HPF POC: NONE SEEN

## 2010-05-01 LAB — CSF CULTURE W GRAM STAIN: Gram Stain: NONE SEEN

## 2010-05-01 LAB — CBC
HCT: 38.1 % (ref 36.0–46.0)
Hemoglobin: 13.4 g/dL (ref 12.0–15.0)
MCHC: 35.1 g/dL (ref 30.0–36.0)
MCV: 91.1 fL (ref 78.0–100.0)
RDW: 12.9 % (ref 11.5–15.5)

## 2010-05-09 ENCOUNTER — Other Ambulatory Visit: Payer: Self-pay | Admitting: Obstetrics

## 2010-05-09 ENCOUNTER — Ambulatory Visit (HOSPITAL_COMMUNITY)
Admission: RE | Admit: 2010-05-09 | Discharge: 2010-05-09 | Disposition: A | Discharge status: Home / Self Care | Payer: Medicaid Other | Source: Ambulatory Visit | Attending: Obstetrics | Admitting: Obstetrics

## 2010-05-09 DIAGNOSIS — N949 Unspecified condition associated with female genital organs and menstrual cycle: Secondary | ICD-10-CM | POA: Insufficient documentation

## 2010-05-09 DIAGNOSIS — N938 Other specified abnormal uterine and vaginal bleeding: Secondary | ICD-10-CM | POA: Insufficient documentation

## 2010-05-09 LAB — PREGNANCY, URINE: Preg Test, Ur: NEGATIVE

## 2010-05-11 ENCOUNTER — Emergency Department (HOSPITAL_COMMUNITY)
Admission: EM | Admit: 2010-05-11 | Discharge: 2010-05-11 | Disposition: A | Discharge status: Home / Self Care | Payer: Medicaid Other | Attending: Emergency Medicine | Admitting: Emergency Medicine

## 2010-05-11 DIAGNOSIS — M25569 Pain in unspecified knee: Secondary | ICD-10-CM | POA: Insufficient documentation

## 2010-05-16 NOTE — Op Note (Signed)
  NAME:  Rhonda Rocha, Rhonda Rocha NO.:  [id number]  MEDICAL RECORD NO.:  [id number]           PATIENT TYPE:  O  LOCATION:  [facility]                          [facility]  PHYSICIAN:  Kathreen Cosier, M.D.DATE OF BIRTH:  [birth year]  DATE OF PROCEDURE:  05/09/2010 DATE OF DISCHARGE:                              OPERATIVE REPORT   PREOPERATIVE DIAGNOSIS:  Dysfunctional uterine bleeding.  POSTOPERATIVE DIAGNOSIS:  Dysfunctional uterine bleeding.  SURGEON:  Kathreen Cosier, MD  ANESTHESIA:  General.  PROCEDURE IN DETAIL:  Under general anesthesia, the patient in lithotomy position, perineum and vagina prepped and draped, and bladder emptied with straight catheter.  Bimanual exam revealed uterus to be normal size, negative adnexa.  Speculum placed in vagina.  Anterior lip of the cervix grasped with tenaculum.  Cervix injected with 9 mL 1% Xylocaine. Endometrial cavity sounded 10 cm.  Cervix dilated to #25 Shawnie Pons. Hysteroscope inserted.  The cavity was normal.  The hysteroscope was removed and a sharp curettage performed.  Moderate amount of tissue obtained.  HCA device was then inserted and there was a 2-minute warm up.  10-minute ablation.  2-minute cool down.  There was total ablation of the cavity.  The patient tolerated the procedure well and taken to recovery room in good condition.          ______________________________ Kathreen Cosier, M.D.     BAM/MEDQ  D:  05/09/2010  T:  05/09/2010  Job:  [id number]  Electronically Signed by Francoise Ceo M.D. on 05/16/2010 08:37:21 AM

## 2010-06-01 ENCOUNTER — Emergency Department (HOSPITAL_COMMUNITY)
Admission: EM | Admit: 2010-06-01 | Discharge: 2010-06-02 | Disposition: A | Discharge status: Home / Self Care | Payer: No Typology Code available for payment source | Attending: Emergency Medicine | Admitting: Emergency Medicine

## 2010-06-01 DIAGNOSIS — M549 Dorsalgia, unspecified: Secondary | ICD-10-CM | POA: Insufficient documentation

## 2010-06-01 DIAGNOSIS — M542 Cervicalgia: Secondary | ICD-10-CM | POA: Insufficient documentation

## 2010-06-02 ENCOUNTER — Emergency Department (HOSPITAL_COMMUNITY): Payer: No Typology Code available for payment source

## 2010-06-05 NOTE — Discharge Summary (Signed)
NAME:  Rhonda Rocha, Rhonda Rocha                ACCOUNT NO.:  [id number]   MEDICAL RECORD NO.:  [id number]          PATIENT TYPE:  INP   LOCATION:  9302                          [facility]ILITY:  [facility]   PHYSICIAN:  Charles A. Clearance Coots, M.D.DATE OF BIRTH:  11/17/1970   DATE OF ADMISSION:  06/17/2008  DATE OF DISCHARGE:  06/21/2008                               DISCHARGE SUMMARY   ADMITTING DIAGNOSES:  7 weeks' gestation, hyperemesis gravidarum.   DISCHARGE DIAGNOSES:  7 weeks' gestation, hyperemesis gravidarum,  improved after nutritional and antiemetic therapy.  Discharged home in  good condition, undelivered at 8 weeks' gestation.   REASON FOR ADMISSION:  A 40 year old G58, P2, estimated date of  confinement of January 28, 2009, presents with a complaint of nausea and  vomiting and cannot keep anything down.   PAST MEDICAL HISTORY:   SURGERY:  None.   ILLNESSES:  None.   MEDICATIONS:  Phenergan suppositories.   ALLERGIES:  No known drug allergies   SOCIAL HISTORY:  Single.  Negative tobacco, alcohol, or recreational  drug use.   FAMILY HISTORY:  No major illnesses given.   REVIEW OF SYSTEMS:  GI:  Remarkable for nausea and vomiting.   PHYSICAL EXAMINATION:  GENERAL:  A well-nourished, well-developed female  in no acute distress, afebrile.  VITAL SIGNS:  Stable.  LUNGS:  Clear to auscultation bilaterally.  HEART:  Regular rate and rhythm.  ABDOMEN:  Nontender.  PELVIC:  Omitted.   ADMITTING LABORATORY FINDINGS:  Glucose 101.   HOSPITAL COURSE:  The patient was admitted and started on IV fluids with  IV antiemetic therapy and nutritional support.  She was feeling much  better by hospital day #3, but was not able to keep fluids down after  taking steroids.  Steroids were discontinued and the patient was able to  tolerate diet.  She was, therefore, discharged home on hospital day #4,  able to tolerate a diet with much less nausea.   DISCHARGE DISPOSITION:   MEDICATIONS:  1. B6 100 mg  p.o. nightly.  2. Doxylamine 25 mg p.o. nightly.  3. Zofran 4 mg q.6 h. p.r.n.  4. Ambien 10 mg p.o. nightly p.r.n.   Routine instructions were given for hyperemesis management.  The patient  is to call our office for a followup appointment in 2 weeks.      Charles A. Clearance Coots, M.D.  Electronically Signed     CAH/MEDQ  D:  06/21/2008  T:  06/21/2008  Job:  [id number]

## 2010-08-15 ENCOUNTER — Ambulatory Visit: Payer: Medicaid Other | Attending: Specialist | Admitting: Physical Therapy

## 2010-08-15 DIAGNOSIS — M545 Low back pain, unspecified: Secondary | ICD-10-CM | POA: Insufficient documentation

## 2010-08-15 DIAGNOSIS — M25579 Pain in unspecified ankle and joints of unspecified foot: Secondary | ICD-10-CM | POA: Insufficient documentation

## 2010-08-15 DIAGNOSIS — IMO0001 Reserved for inherently not codable concepts without codable children: Secondary | ICD-10-CM | POA: Insufficient documentation

## 2010-08-24 ENCOUNTER — Encounter: Payer: No Typology Code available for payment source | Admitting: Physical Therapy

## 2010-08-29 ENCOUNTER — Encounter: Payer: No Typology Code available for payment source | Admitting: Physical Therapy

## 2010-08-30 ENCOUNTER — Ambulatory Visit: Payer: Medicaid Other | Attending: Specialist | Admitting: Physical Therapy

## 2010-08-30 DIAGNOSIS — IMO0001 Reserved for inherently not codable concepts without codable children: Secondary | ICD-10-CM | POA: Insufficient documentation

## 2010-08-30 DIAGNOSIS — M25579 Pain in unspecified ankle and joints of unspecified foot: Secondary | ICD-10-CM | POA: Insufficient documentation

## 2010-08-30 DIAGNOSIS — M545 Low back pain, unspecified: Secondary | ICD-10-CM | POA: Insufficient documentation

## 2010-09-05 ENCOUNTER — Ambulatory Visit: Payer: Medicaid Other | Admitting: Physical Therapy

## 2010-12-18 ENCOUNTER — Emergency Department (HOSPITAL_COMMUNITY)
Admission: EM | Admit: 2010-12-18 | Discharge: 2010-12-18 | Disposition: A | Discharge status: Home / Self Care | Payer: Self-pay | Attending: Emergency Medicine | Admitting: Emergency Medicine

## 2010-12-18 ENCOUNTER — Encounter: Payer: Self-pay | Admitting: Emergency Medicine

## 2010-12-18 DIAGNOSIS — N390 Urinary tract infection, site not specified: Secondary | ICD-10-CM | POA: Insufficient documentation

## 2010-12-18 DIAGNOSIS — N760 Acute vaginitis: Secondary | ICD-10-CM | POA: Insufficient documentation

## 2010-12-18 DIAGNOSIS — A499 Bacterial infection, unspecified: Secondary | ICD-10-CM | POA: Insufficient documentation

## 2010-12-18 DIAGNOSIS — B9689 Other specified bacterial agents as the cause of diseases classified elsewhere: Secondary | ICD-10-CM | POA: Insufficient documentation

## 2010-12-18 LAB — URINALYSIS, ROUTINE W REFLEX MICROSCOPIC
Nitrite: NEGATIVE
Protein, ur: 30 mg/dL — AB
Urobilinogen, UA: 1 mg/dL (ref 0.0–1.0)

## 2010-12-18 LAB — URINE MICROSCOPIC-ADD ON

## 2010-12-18 LAB — WET PREP, GENITAL

## 2010-12-18 MED ORDER — METRONIDAZOLE 500 MG PO TABS: 500.0000 mg | ORAL_TABLET | Freq: Two times a day (BID) | ORAL | Status: AC

## 2010-12-18 MED ORDER — NITROFURANTOIN MONOHYD MACRO 100 MG PO CAPS: 100.0000 mg | ORAL_CAPSULE | Freq: Two times a day (BID) | ORAL | Status: AC

## 2010-12-18 NOTE — ED Notes (Signed)
ZOX:[id number]<AV> Expected date:12/18/10<BR> Expected time: 4:39 PM<BR> Means of arrival:Ambulance<BR> Comments:<BR> EMS 61 GC, weakness

## 2010-12-18 NOTE — ED Provider Notes (Signed)
History     CSN: [id number] Arrival date & time: 12/18/2010  2:27 PM   First MD Initiated Contact with Patient 12/18/10 1651      Chief Complaint  Patient presents with  . Urinary Tract Infection    (Consider location/radiation/quality/duration/timing/severity/associated sxs/prior treatment) Patient is a 40 y.o. female presenting with dysuria. The history is provided by the patient.  Dysuria  This is a new problem. The current episode started 12 to 24 hours ago. The problem occurs every urination. The problem has been gradually worsening. The quality of the pain is described as burning. The pain is moderate. There has been no fever. She is sexually active. There is no history of pyelonephritis. Associated symptoms include discharge and urgency. Pertinent negatives include no chills, no sweats, no nausea, no vomiting, no frequency, no hematuria, no hesitancy, no possible pregnancy and no flank pain. She has tried nothing for the symptoms. Her past medical history does not include kidney stones, recurrent UTIs or catheterization.  Pt also reports foul vaginal odor x several months that she has tried to resolve with douching without any change after self tx.   History reviewed. No pertinent past medical history.  Past Surgical History  Procedure Date  . Cesarean section   . Uterine ablation     History reviewed. No pertinent family history.  History  Substance Use Topics  . Smoking status: Not on file  . Smokeless tobacco: Not on file  . Alcohol Use: No    Review of Systems  Constitutional: Negative for fever and chills.  Gastrointestinal: Negative for nausea, vomiting and abdominal pain.       Positive suprapubic pressure  Genitourinary: Positive for dysuria, urgency and vaginal discharge. Negative for hesitancy, frequency, hematuria, flank pain, vaginal bleeding, difficulty urinating, vaginal pain, pelvic pain and dyspareunia.  Musculoskeletal: Negative for back pain.    Neurological: Negative for weakness and light-headedness.    Allergies  Review of patient's allergies indicates no known allergies.  Home Medications  No current outpatient prescriptions on file.  BP 121/69  Pulse 78  Temp(Src) 98.8 F (37.1 C) (Oral)  Resp 16  SpO2 98%  LMP 11/24/2010  Physical Exam  Constitutional: She is oriented to person, place, and time. She appears well-developed and well-nourished. No distress.  HENT:  Head: Normocephalic and atraumatic.  Eyes: EOM are normal. Pupils are equal, round, and reactive to light.  Neck: Normal range of motion. Neck supple.  Cardiovascular: Normal rate, regular rhythm and normal heart sounds.   Pulmonary/Chest: Effort normal and breath sounds normal.  Abdominal: Soft. Bowel sounds are normal. She exhibits no distension and no mass. There is no guarding.       Mild suprapubic TTP  Genitourinary: Uterus normal. There is no rash, tenderness, lesion or injury on the right labia. There is no rash, tenderness, lesion or injury on the left labia. Cervix exhibits no motion tenderness, no discharge and no friability. Right adnexum displays no mass and no tenderness. Left adnexum displays no mass and no tenderness. No erythema, tenderness or bleeding around the vagina. No foreign body around the vagina. No signs of injury around the vagina. Vaginal discharge found.  Musculoskeletal: Normal range of motion. She exhibits no edema and no tenderness.  Neurological: She is alert and oriented to person, place, and time.  Skin: Skin is warm and dry. No rash noted.  Psychiatric: She has a normal mood and affect. Her behavior is normal.    ED Course  Procedures (including critical  care time)  Labs Reviewed  URINALYSIS, ROUTINE W REFLEX MICROSCOPIC - Abnormal; Notable for the following:    Color, Urine AMBER (*) BIOCHEMICALS MAY BE AFFECTED BY COLOR   Appearance CLOUDY (*)    Hgb urine dipstick LARGE (*)    Ketones, ur TRACE (*)    Protein,  ur 30 (*)    Leukocytes, UA LARGE (*)    All other components within normal limits  URINE MICROSCOPIC-ADD ON - Abnormal; Notable for the following:    Squamous Epithelial / LPF FEW (*)    Bacteria, UA FEW (*)    All other components within normal limits  WET PREP, GENITAL - Abnormal; Notable for the following:    Clue Cells, Wet Prep MODERATE (*)    WBC, Wet Prep HPF POC FEW (*)    All other components within normal limits  POCT PREGNANCY, URINE  GC/CHLAMYDIA PROBE AMP, GENITAL   No results found.   1. UTI (lower urinary tract infection)   2. BV (bacterial vaginosis)       MDM  Labs reviewed, demonstrate UTI and Bacterial Vaginosis. Have advised pt on ways to maintain appropriate vaginal flora.        Elwyn Reach [place], [place] 12/18/10 1839

## 2010-12-18 NOTE — ED Notes (Signed)
Pt c/o burning with urination. States had sexual intercourse yesterday and began vaginal burning after. Denies discharge

## 2010-12-19 LAB — GC/CHLAMYDIA PROBE AMP, GENITAL: GC Probe Amp, Genital: NEGATIVE

## 2010-12-19 NOTE — ED Provider Notes (Signed)
Medical screening examination/treatment/procedure(s) were performed by non-physician practitioner and as supervising physician I was immediately available for consultation/collaboration. Devoria Albe, MD, FACEP   Ward Givens, MD 12/19/10 [phone]

## 2012-06-06 ENCOUNTER — Encounter (HOSPITAL_COMMUNITY): Payer: Self-pay | Admitting: Family Medicine

## 2012-06-06 ENCOUNTER — Emergency Department (HOSPITAL_COMMUNITY)
Admission: EM | Admit: 2012-06-06 | Discharge: 2012-06-06 | Disposition: A | Discharge status: Home / Self Care | Payer: Medicaid Other | Attending: Emergency Medicine | Admitting: Emergency Medicine

## 2012-06-06 DIAGNOSIS — B9689 Other specified bacterial agents as the cause of diseases classified elsewhere: Secondary | ICD-10-CM

## 2012-06-06 DIAGNOSIS — B009 Herpesviral infection, unspecified: Secondary | ICD-10-CM | POA: Insufficient documentation

## 2012-06-06 DIAGNOSIS — N760 Acute vaginitis: Secondary | ICD-10-CM | POA: Insufficient documentation

## 2012-06-06 DIAGNOSIS — Z3202 Encounter for pregnancy test, result negative: Secondary | ICD-10-CM | POA: Insufficient documentation

## 2012-06-06 DIAGNOSIS — N898 Other specified noninflammatory disorders of vagina: Secondary | ICD-10-CM | POA: Insufficient documentation

## 2012-06-06 LAB — WET PREP, GENITAL

## 2012-06-06 MED ORDER — METRONIDAZOLE 500 MG PO TABS: 500.0000 mg | ORAL_TABLET | Freq: Two times a day (BID) | ORAL | Status: DC

## 2012-06-06 MED ORDER — ACYCLOVIR 400 MG PO TABS: 800.0000 mg | ORAL_TABLET | Freq: Two times a day (BID) | ORAL | Status: DC

## 2012-06-06 MED ORDER — IBUPROFEN 800 MG PO TABS
800.0000 mg | ORAL_TABLET | Freq: Once | ORAL | Status: AC
  Administered 2012-06-06: 800 mg via ORAL
  Filled 2012-06-06: qty 1

## 2012-06-06 NOTE — ED Notes (Signed)
Pt reports "sore" to vagina and white colored discharge. Ongoing for several days. Denies foul odor. Denies bleeding. No itching. Pt is alert and oriented x4. No signs of distress noted.

## 2012-06-06 NOTE — ED Notes (Signed)
Per pt noticed yesterday sore to vaginal area. sts vaginal discharge is more than normal.

## 2012-06-06 NOTE — ED Notes (Signed)
Pt discharged home. Had no further questions. Encouraged to follow up with health department.

## 2012-06-06 NOTE — ED Provider Notes (Signed)
History     CSN: [id number]  Arrival date & time 06/06/12  4098   First MD Initiated Contact with Patient 06/06/12 0900      Chief Complaint  Patient presents with  . Vaginal Itching    (Consider location/radiation/quality/duration/timing/severity/associated sxs/prior treatment) HPI  Pt is a 42 yo F PMHx significant for genital HSV presenting for copious white vaginal discharge since Thursday and an erythematous non-draining sore in the vaginal region that she first noticed yesterday. Pt states she had one outbreak of HSV twenty years ago, but since then has never had another outbreak. Patient denies any history of other STIs in past. Pt is post menopausal. Denies fevers, chills, nausea, vomiting.   History reviewed. No pertinent past medical history.  Past Surgical History  Procedure Laterality Date  . Cesarean section    . Uterine ablation      History reviewed. No pertinent family history.  History  Substance Use Topics  . Smoking status: Not on file  . Smokeless tobacco: Not on file  . Alcohol Use: No    OB History   Grav Para Term Preterm Abortions TAB SAB Ect Mult Living                  Review of Systems  Constitutional: Negative for fever and chills.  Respiratory: Negative for shortness of breath.   Cardiovascular: Negative for chest pain.  Gastrointestinal: Negative for abdominal distention.  Genitourinary: Positive for vaginal discharge and genital sores. Negative for dysuria and pelvic pain.  All other systems reviewed and are negative.    Allergies  Review of patient's allergies indicates no known allergies.  Home Medications   Current Outpatient Rx  Name  Route  Sig  Dispense  Refill  . acyclovir (ZOVIRAX) 400 MG tablet   Oral   Take 2 tablets (800 mg total) by mouth 2 (two) times daily.   10 tablet   0   . metroNIDAZOLE (FLAGYL) 500 MG tablet   Oral   Take 1 tablet (500 mg total) by mouth 2 (two) times daily.   14 tablet   0      BP 123/84  Pulse 95  Temp(Src) 98.7 F (37.1 C) (Oral)  Resp 16  SpO2 100%  LMP 05/20/2012  Physical Exam  Constitutional: She is oriented to person, place, and time. She appears well-developed and well-nourished.  HENT:  Head: Normocephalic and atraumatic.  Eyes: Conjunctivae are normal.  Neck: Neck supple.  Cardiovascular: Normal rate, regular rhythm and normal heart sounds.   Pulmonary/Chest: Effort normal and breath sounds normal.  Genitourinary: Cervix exhibits no motion tenderness and no friability. No erythema, tenderness or bleeding around the vagina. No foreign body around the vagina. No signs of injury around the vagina. Vaginal discharge found.  Neurological: She is alert and oriented to person, place, and time.  Skin: Skin is warm and dry.    ED Course  Procedures (including critical care time)  Exam performed by Francee Piccolo L,  exam chaperoned Date: 06/06/2012 Pelvic exam: normal external genitalia without evidence of trauma. VULVA: normal appearing vulva with no masses, tenderness or lesion. VAGINA: normal appearing vagina with normal color and discharge, no lesions. CERVIX: normal appearing cervix without lesions, cervical motion tenderness absent, cervical os closed with out purulent discharge; vaginal discharge - white and copious, Wet prep and DNA probe for chlamydia and GC obtained.   ADNEXA: normal adnexa in size, nontender and no masses UTERUS: uterus is normal size, shape, consistency and  nontender.     Labs Reviewed  WET PREP, GENITAL - Abnormal; Notable for the following:    Clue Cells Wet Prep HPF POC MANY (*)    WBC, Wet Prep HPF POC RARE (*)    All other components within normal limits  GC/CHLAMYDIA PROBE AMP  HERPES SIMPLEX VIRUS CULTURE  URINALYSIS, ROUTINE W REFLEX MICROSCOPIC  POCT PREGNANCY, URINE   No results found.   1. Bacterial vaginosis   2. Herpes simplex       MDM  Patient to be discharged with instructions  to follow up with OBGYN. Pt understands GC/Chlamydia cultures pending and that they will need to inform all sexual partners within the last 6 months if results return positive. Cultures also obtained for HSV. Pt advised that she will receive a call in 48 hours if the test is positive and to refrain from sexual activity for 48 hours. If the test is positive, pt is advised to return for treatment and needs to refrain from sexual activity for 10 days for the medicine to take effect.  Pt not concerning for PID because hemodynamically stable and no cervical motion tenderness on pelvic exam. Pt has also been treated with flagyl for Bacterial Vaginosis. Pt has been advised to not drink alcohol while on this medication. Discussed that because pt has had recent unprotected sex, might want to consider getting tested for HIV as well. Counseled pt that latex condoms are the only way to prevent against STDs or HIV. Patient d/w with Dr. Blinda Leatherwood, agrees with plan.               Jeannetta Ellis, PA-C 06/06/12 1603

## 2012-06-07 NOTE — ED Provider Notes (Signed)
Medical screening examination/treatment/procedure(s) were conducted as a shared visit with non-physician practitioner(s) and myself.  I personally evaluated the patient during the encounter.  Patient requested physician examination. I did examine the lesion and it does look suspicious for genital herpes. Culture has been obtained. Patient to be initiated on treatment empirically.  Gilda Crease, MD 06/07/12 [phone]

## 2012-06-08 ENCOUNTER — Telehealth (HOSPITAL_COMMUNITY): Payer: Self-pay | Admitting: Emergency Medicine

## 2012-06-08 LAB — HERPES SIMPLEX VIRUS CULTURE: Culture: DETECTED

## 2012-06-08 LAB — GC/CHLAMYDIA PROBE AMP
CT Probe RNA: NEGATIVE
GC Probe RNA: NEGATIVE

## 2012-06-08 NOTE — Telephone Encounter (Signed)
Pt called to get lab results. Results given.

## 2012-11-15 ENCOUNTER — Emergency Department (HOSPITAL_COMMUNITY)
Admission: EM | Admit: 2012-11-15 | Discharge: 2012-11-15 | Disposition: A | Discharge status: Home / Self Care | Payer: Medicaid Other | Attending: Emergency Medicine | Admitting: Emergency Medicine

## 2012-11-15 ENCOUNTER — Encounter (HOSPITAL_COMMUNITY): Payer: Self-pay | Admitting: Emergency Medicine

## 2012-11-15 ENCOUNTER — Emergency Department (HOSPITAL_COMMUNITY): Payer: Medicaid Other

## 2012-11-15 DIAGNOSIS — Z79899 Other long term (current) drug therapy: Secondary | ICD-10-CM | POA: Insufficient documentation

## 2012-11-15 DIAGNOSIS — B3731 Acute candidiasis of vulva and vagina: Secondary | ICD-10-CM | POA: Insufficient documentation

## 2012-11-15 DIAGNOSIS — S92309A Fracture of unspecified metatarsal bone(s), unspecified foot, initial encounter for closed fracture: Secondary | ICD-10-CM | POA: Insufficient documentation

## 2012-11-15 DIAGNOSIS — Y929 Unspecified place or not applicable: Secondary | ICD-10-CM | POA: Insufficient documentation

## 2012-11-15 DIAGNOSIS — IMO0002 Reserved for concepts with insufficient information to code with codable children: Secondary | ICD-10-CM | POA: Insufficient documentation

## 2012-11-15 DIAGNOSIS — B379 Candidiasis, unspecified: Secondary | ICD-10-CM

## 2012-11-15 DIAGNOSIS — Y939 Activity, unspecified: Secondary | ICD-10-CM | POA: Insufficient documentation

## 2012-11-15 DIAGNOSIS — S92912A Unspecified fracture of left toe(s), initial encounter for closed fracture: Secondary | ICD-10-CM

## 2012-11-15 DIAGNOSIS — B373 Candidiasis of vulva and vagina: Secondary | ICD-10-CM | POA: Insufficient documentation

## 2012-11-15 LAB — URINE MICROSCOPIC-ADD ON

## 2012-11-15 LAB — URINALYSIS, ROUTINE W REFLEX MICROSCOPIC
Glucose, UA: NEGATIVE mg/dL
Ketones, ur: 15 mg/dL — AB
Leukocytes, UA: NEGATIVE
Nitrite: NEGATIVE
Specific Gravity, Urine: 1.037 — ABNORMAL HIGH (ref 1.005–1.030)
Urobilinogen, UA: 1 mg/dL (ref 0.0–1.0)

## 2012-11-15 LAB — WET PREP, GENITAL
Trich, Wet Prep: NONE SEEN
Yeast Wet Prep HPF POC: NONE SEEN

## 2012-11-15 MED ORDER — FLUCONAZOLE 150 MG PO TABS: 150.0000 mg | ORAL_TABLET | Freq: Once | ORAL | Status: DC

## 2012-11-15 MED ORDER — HYDROCODONE-ACETAMINOPHEN 5-325 MG PO TABS: 2.0000 | ORAL_TABLET | Freq: Four times a day (QID) | ORAL | Status: DC | PRN

## 2012-11-15 NOTE — ED Provider Notes (Signed)
CSN: [id number]     Arrival date & time 11/15/12  1926 History   First MD Initiated Contact with Patient 11/15/12 2040    This chart was scribed for Ivar Drape PA-C, a non-physician practitioner working with Gwyneth Sprout, MD by Lewanda Rife, ED Scribe. This patient was seen in room TR11C/TR11C and the patient's care was started at 8:54 PM     Chief Complaint  Patient presents with  . Toe Pain   (Consider location/radiation/quality/duration/timing/severity/associated sxs/prior Treatment) The history is provided by the patient. No language interpreter was used.   HPI Comments: Rhonda Rocha is a 42 y.o. female who presents to the Emergency Department complaining of right 3rd toe pain onset 4 days after hitting on a vacuum clear. Reports associated swelling, and redness. Reports pain is exacerbated by touch and walking. Denies any alleviating factors. Denies associated fever, numbness, and other trauma.  Additionally, reports possible yeast infection. Reports PMHx of recurrent yeast infections. Reports associated constant worsening vaginal itchiness onset several days. Denies any aggravating or alleviating factors. Denies associated fever, dysuria, abdominal pain, back pain,nausea, diarrhea, emesis, and hematuria.  History reviewed. No pertinent past medical history. Past Surgical History  Procedure Laterality Date  . Cesarean section    . Uterine ablation     No family history on file. History  Substance Use Topics  . Smoking status: Never Smoker   . Smokeless tobacco: Not on file  . Alcohol Use: No   OB History   Grav Para Term Preterm Abortions TAB SAB Ect Mult Living                 Review of Systems  Constitutional: Negative for fever.  Musculoskeletal:       Right 3rd toe pain   All other systems reviewed and are negative.   A complete 10 system review of systems was obtained and all systems are negative except as noted in the HPI and PMHx.    Allergies   Review of patient's allergies indicates no known allergies.  Home Medications   Current Outpatient Rx  Name  Route  Sig  Dispense  Refill  . cyproheptadine (PERIACTIN) 4 MG tablet   Oral   Take 4 mg by mouth 3 (three) times daily as needed.          LMP 10/15/2012 Physical Exam  Nursing note and vitals reviewed. Constitutional: She is oriented to person, place, and time. She appears well-developed and well-nourished. No distress.  HENT:  Head: Normocephalic and atraumatic.  Eyes: EOM are normal.  Neck: Neck supple. No tracheal deviation present.  Cardiovascular: Normal rate.   Pulmonary/Chest: Effort normal. No respiratory distress.  Abdominal: Soft. She exhibits no distension. There is no tenderness. There is no rebound and no guarding. Hernia confirmed negative in the right inguinal area and confirmed negative in the left inguinal area.  No CVA tenderness  Genitourinary: No labial fusion. There is no rash, tenderness, lesion or injury on the right labia. There is no rash, tenderness, lesion or injury on the left labia. Uterus is not deviated, not enlarged, not fixed and not tender. Cervix exhibits no motion tenderness, no discharge and no friability. Right adnexum displays no mass, no tenderness and no fullness. Left adnexum displays no mass, no tenderness and no fullness. No erythema, tenderness or bleeding around the vagina. No foreign body around the vagina. No signs of injury around the vagina. Vaginal discharge found.  Musculoskeletal: Normal range of motion. She exhibits tenderness (left 3rd  toe).  Lymphadenopathy:       Right: No inguinal adenopathy present.       Left: No inguinal adenopathy present.  Neurological: She is alert and oriented to person, place, and time.  Skin: Skin is warm and dry.  Psychiatric: She has a normal mood and affect. Her behavior is normal.    ED Course  Procedures  COORDINATION OF CARE:  Nursing notes reviewed. Vital signs reviewed.  Initial pt interview and examination performed.   8:54 PM-Discussed work up plan with pt at bedside, which includes x-ray of right foot, UA, wet-prep, and GC/chlamydia . Pt agrees with plan.  10:14 PM Nursing Notes Reviewed/ Care Coordinated Applicable Imaging Reviewed  Interpretation of Laboratory Data incorporated into ED treatment Discussed results and treatment plan with pt. Pt demonstrates understanding and agrees with plan. Pt informed of return precautions and is comfortable with discharge at this time.     Treatment plan initiated:Medications - No data to display   Initial diagnostic testing ordered.    Labs Review Labs Reviewed  WET PREP, GENITAL - Abnormal; Notable for the following:    WBC, Wet Prep HPF POC FEW (*)    All other components within normal limits  URINALYSIS, ROUTINE W REFLEX MICROSCOPIC - Abnormal; Notable for the following:    Color, Urine AMBER (*)    APPearance HAZY (*)    Specific Gravity, Urine 1.037 (*)    Hgb urine dipstick MODERATE (*)    Bilirubin Urine SMALL (*)    Ketones, ur 15 (*)    All other components within normal limits  URINE MICROSCOPIC-ADD ON - Abnormal; Notable for the following:    Bacteria, UA FEW (*)    All other components within normal limits  GC/CHLAMYDIA PROBE AMP   Imaging Review Dg Foot Complete Right  11/15/2012   CLINICAL DATA:  3rd toe pain after injury.  EXAM: RIGHT FOOT COMPLETE - 3+ VIEW  COMPARISON:  July 24, 2009.  FINDINGS: There is a minimally displaced fracture involving the proximal base of the 3rd metatarsal. Joint spaces are intact. No soft tissue abnormality is noted.  IMPRESSION: Minimally displaced fracture involving proximal base of 3rd metatarsal.   Electronically Signed   By: Roque Lias M.D.   On: 11/15/2012 21:14    EKG Interpretation   None       MDM   1. Toe fracture, left, closed, initial encounter   2. Yeast infection    I personally performed the services described in this  documentation, which was scribed in my presence. The recorded information has been reviewed and is accurate.    Roxy Horseman, PA-C 11/15/12 2302

## 2012-11-15 NOTE — ED Notes (Signed)
C/o R 3rd toe pain, swelling, and redness since hitting it on a vacuum cleaner Thursday.

## 2012-11-16 LAB — GC/CHLAMYDIA PROBE AMP
CT Probe RNA: NEGATIVE
GC Probe RNA: NEGATIVE

## 2012-11-16 NOTE — ED Provider Notes (Signed)
Medical screening examination/treatment/procedure(s) were performed by non-physician practitioner and as supervising physician I was immediately available for consultation/collaboration.  EKG Interpretation   None         Price Lachapelle, MD 11/16/12 0005 

## 2012-11-26 ENCOUNTER — Emergency Department (HOSPITAL_COMMUNITY)
Admission: EM | Admit: 2012-11-26 | Discharge: 2012-11-26 | Disposition: A | Discharge status: Home / Self Care | Payer: Medicaid Other | Attending: Emergency Medicine | Admitting: Emergency Medicine

## 2012-11-26 ENCOUNTER — Encounter (HOSPITAL_COMMUNITY): Payer: Self-pay | Admitting: Emergency Medicine

## 2012-11-26 DIAGNOSIS — R1031 Right lower quadrant pain: Secondary | ICD-10-CM | POA: Insufficient documentation

## 2012-11-26 DIAGNOSIS — M545 Low back pain, unspecified: Secondary | ICD-10-CM | POA: Insufficient documentation

## 2012-11-26 DIAGNOSIS — Z3202 Encounter for pregnancy test, result negative: Secondary | ICD-10-CM | POA: Insufficient documentation

## 2012-11-26 LAB — CBC WITH DIFFERENTIAL/PLATELET
Eosinophils Absolute: 0 10*3/uL (ref 0.0–0.7)
HCT: 45 % (ref 36.0–46.0)
Hemoglobin: 13.3 g/dL (ref 12.0–15.0)
Lymphocytes Relative: 46 % (ref 12–46)
Lymphs Abs: 2.1 10*3/uL (ref 0.7–4.0)
MCH: 31.7 pg (ref 26.0–34.0)
MCV: 107.4 fL — ABNORMAL HIGH (ref 78.0–100.0)
Monocytes Relative: 6 % (ref 3–12)
Neutrophils Relative %: 47 % (ref 43–77)
Platelets: 203 10*3/uL (ref 150–400)
RBC: 4.19 MIL/uL (ref 3.87–5.11)
WBC: 4.6 10*3/uL (ref 4.0–10.5)

## 2012-11-26 LAB — URINALYSIS, ROUTINE W REFLEX MICROSCOPIC
Bilirubin Urine: NEGATIVE
Glucose, UA: NEGATIVE mg/dL
Ketones, ur: NEGATIVE mg/dL
Nitrite: NEGATIVE
Specific Gravity, Urine: 1.024 (ref 1.005–1.030)
Urobilinogen, UA: 1 mg/dL (ref 0.0–1.0)
pH: 6.5 (ref 5.0–8.0)

## 2012-11-26 LAB — BASIC METABOLIC PANEL
BUN: 13 mg/dL (ref 6–23)
CO2: 27 mEq/L (ref 19–32)
Calcium: 8.3 mg/dL — ABNORMAL LOW (ref 8.4–10.5)
Creatinine, Ser: 0.78 mg/dL (ref 0.50–1.10)
GFR calc Af Amer: >90 mL/min (ref >90)
Glucose, Bld: 83 mg/dL (ref 70–99)
Potassium: 3.8 mEq/L (ref 3.5–5.1)
Sodium: 138 mEq/L (ref 135–145)

## 2012-11-26 LAB — URINE MICROSCOPIC-ADD ON

## 2012-11-26 LAB — POCT PREGNANCY, URINE: Preg Test, Ur: NEGATIVE

## 2012-11-26 MED ORDER — METHOCARBAMOL 500 MG PO TABS: 500.0000 mg | ORAL_TABLET | Freq: Four times a day (QID) | ORAL | Status: DC | PRN

## 2012-11-26 MED ORDER — MELOXICAM 7.5 MG PO TABS: 7.5000 mg | ORAL_TABLET | Freq: Every day | ORAL | Status: DC

## 2012-11-26 MED ORDER — KETOROLAC TROMETHAMINE 60 MG/2ML IM SOLN
60.0000 mg | Freq: Once | INTRAMUSCULAR | Status: AC
  Administered 2012-11-26: 60 mg via INTRAMUSCULAR
  Filled 2012-11-26: qty 2

## 2012-11-26 NOTE — ED Notes (Signed)
Pt arrives to ed c/o right sided back pain x 4 days.  Pt denies recent illness/injury or hx of back pain/surgery.  Pmsx4, caox4, nad.

## 2012-11-26 NOTE — ED Provider Notes (Signed)
Medical screening examination/treatment/procedure(s) were performed by non-physician practitioner and as supervising physician I was immediately available for consultation/collaboration.  EKG Interpretation   None         William Wilfredo Canterbury, MD 11/26/12 1536 

## 2012-11-26 NOTE — ED Provider Notes (Signed)
CSN: [id number]     Arrival date & time 11/26/12  4098 History   First MD Initiated Contact with Patient 11/26/12 [phone]     Chief Complaint  Patient presents with  . Back Pain   (Consider location/radiation/quality/duration/timing/severity/associated sxs/prior Treatment) The history is provided by the patient.   Patient presents with right lower back pain that has been intemrittent x 2 weeks and constant x 3-4 days.  Pain is consstant, sharp, worse with movement, occasional radiates into lower abdomen and also down right leg.  Has tried NSAIDs, muscle relaxants, and hydrocodone as well as warm baths without relief.  Denies any injury, fall, MVC, heavy lifting.  No hx cancer or IVDU.  Denies fevers, chills, abdominal pain, N/V/D, dysuria, frequency or urgency, abnormal vaginal discharge or bleeding, numbness or weakness of the legs, loss of control of bowel or bladder.  LMP last week was on time and normal.   History reviewed. No pertinent past medical history. Past Surgical History  Procedure Laterality Date  . Cesarean section    . Uterine ablation     History reviewed. No pertinent family history. History  Substance Use Topics  . Smoking status: Never Smoker   . Smokeless tobacco: Not on file  . Alcohol Use: No   OB History   Grav Para Term Preterm Abortions TAB SAB Ect Mult Living                 Review of Systems  Constitutional: Negative for fever and chills.  Respiratory: Negative for cough and shortness of breath.   Cardiovascular: Negative for chest pain.  Gastrointestinal: Negative for nausea, vomiting, abdominal pain and diarrhea.  Genitourinary: Negative for dysuria, urgency, frequency, vaginal bleeding, vaginal discharge and menstrual problem.    Allergies  Review of patient's allergies indicates no known allergies.  Home Medications   Current Outpatient Rx  Name  Route  Sig  Dispense  Refill  . cyproheptadine (PERIACTIN) 4 MG tablet   Oral   Take 4 mg by  mouth 3 (three) times daily as needed for allergies.          . Naproxen Sodium (ALEVE) 220 MG CAPS   Oral   Take 880 mg by mouth daily as needed (pain).          BP 102/65  Pulse 75  Temp(Src) 97.6 F (36.4 C) (Oral)  Resp 16  SpO2 97%  LMP 10/15/2012 Physical Exam  Nursing note and vitals reviewed. Constitutional: She appears well-developed and well-nourished. No distress.  HENT:  Head: Normocephalic and atraumatic.  Neck: Neck supple.  Pulmonary/Chest: Effort normal.  Abdominal: Soft. She exhibits no distension and no mass. There is tenderness in the right lower quadrant. There is no rebound and no guarding.  Musculoskeletal:       Arms: Spine no focal tenderness, no crepitus, or stepoffs.   Lower extremities:  Strength 5/5, sensation intact, distal pulses intact.     Neurological: She is alert.  Skin: She is not diaphoretic.    ED Course  Procedures (including critical care time) Labs Review Labs Reviewed  CBC WITH DIFFERENTIAL - Abnormal; Notable for the following:    MCV 107.4 (*)    MCHC 29.6 (*)    All other components within normal limits  BASIC METABOLIC PANEL - Abnormal; Notable for the following:    Calcium 8.3 (*)    All other components within normal limits  URINALYSIS, ROUTINE W REFLEX MICROSCOPIC - Abnormal; Notable for the following:  APPearance HAZY (*)    Hgb urine dipstick TRACE (*)    All other components within normal limits  URINE MICROSCOPIC-ADD ON - Abnormal; Notable for the following:    Squamous Epithelial / LPF FEW (*)    Bacteria, UA MANY (*)    All other components within normal limits  POCT PREGNANCY, URINE   Imaging Review No results found.  EKG Interpretation   None       MDM   1. Low back pain    Pt with right sided low back pain without injury.  No red flags.  Neurovascularly intact. Pain is worse with movement and is primarily in lower back though she did have some mild abdominal tenderness.  Because of this  tenderness, I ordered labs and UA, Urine pregnancy.  The tests were unremarkable and patient is having no other associated symptoms that are concerning for appendicitis, or acute GU pathology.  Pt d/c home with mobic and robaxin.  PCP follow up.  Discussed result, findings, treatment, and follow up  with patient.  Pt given return precautions.  Pt verbalizes understanding and agrees with plan.        Trixie Dredge, PA-C 11/26/12 1514

## 2012-12-12 ENCOUNTER — Other Ambulatory Visit (HOSPITAL_COMMUNITY)
Admission: RE | Admit: 2012-12-12 | Discharge: 2012-12-12 | Disposition: A | Discharge status: Home / Self Care | Payer: Medicaid Other | Source: Ambulatory Visit | Attending: Family Medicine | Admitting: Family Medicine

## 2012-12-12 ENCOUNTER — Emergency Department (INDEPENDENT_AMBULATORY_CARE_PROVIDER_SITE_OTHER)
Admission: EM | Admit: 2012-12-12 | Discharge: 2012-12-12 | Disposition: A | Discharge status: Home / Self Care | Payer: Medicaid Other | Source: Home / Self Care | Attending: Family Medicine | Admitting: Family Medicine

## 2012-12-12 ENCOUNTER — Encounter (HOSPITAL_COMMUNITY): Payer: Self-pay | Admitting: Emergency Medicine

## 2012-12-12 DIAGNOSIS — N760 Acute vaginitis: Secondary | ICD-10-CM | POA: Insufficient documentation

## 2012-12-12 DIAGNOSIS — Z113 Encounter for screening for infections with a predominantly sexual mode of transmission: Secondary | ICD-10-CM | POA: Insufficient documentation

## 2012-12-12 LAB — POCT URINALYSIS DIP (DEVICE)
Glucose, UA: NEGATIVE mg/dL
Nitrite: NEGATIVE
Protein, ur: NEGATIVE mg/dL
Specific Gravity, Urine: >=1.03 (ref 1.005–1.030)
Urobilinogen, UA: 1 mg/dL (ref 0.0–1.0)

## 2012-12-12 LAB — POCT PREGNANCY, URINE: Preg Test, Ur: NEGATIVE

## 2012-12-12 MED ORDER — METRONIDAZOLE 500 MG PO TABS: 500.0000 mg | ORAL_TABLET | Freq: Two times a day (BID) | ORAL | Status: DC

## 2012-12-12 MED ORDER — FLUCONAZOLE 150 MG PO TABS: ORAL_TABLET | ORAL | Status: DC

## 2012-12-12 NOTE — ED Notes (Signed)
Call back number for lab issues verified at release  

## 2012-12-12 NOTE — ED Notes (Signed)
Concern for BV vs yeast vs STD

## 2012-12-12 NOTE — ED Provider Notes (Signed)
CSN: [id number]     Arrival date & time 12/12/12  1853 History   First MD Initiated Contact with Patient 12/12/12 1927     Chief Complaint  Patient presents with  . Vaginal Discharge   (Consider location/radiation/quality/duration/timing/severity/associated sxs/prior Treatment) HPI Comments: 42 year old female presents complaining of vaginal discharge and irritation for the past 2 days. She has a history of having BV and yeast infections and will times so she assumes it is one of the 2. She denies any risk factors for STDs. She is not having any fever, chills, NVD, pelvic pain, abdominal pain. There is no notable odor to the discharge   History reviewed. No pertinent past medical history. Past Surgical History  Procedure Laterality Date  . Cesarean section    . Uterine ablation     History reviewed. No pertinent family history. History  Substance Use Topics  . Smoking status: Never Smoker   . Smokeless tobacco: Not on file  . Alcohol Use: No   OB History   Grav Para Term Preterm Abortions TAB SAB Ect Mult Living                 Review of Systems  Constitutional: Negative for fever and chills.  Eyes: Negative for visual disturbance.  Respiratory: Negative for cough and shortness of breath.   Cardiovascular: Negative for chest pain, palpitations and leg swelling.  Gastrointestinal: Negative for nausea, vomiting and abdominal pain.  Endocrine: Negative for polydipsia and polyuria.  Genitourinary: Positive for vaginal discharge. Negative for dysuria, urgency, frequency, genital sores and pelvic pain.  Musculoskeletal: Negative for arthralgias and myalgias.  Skin: Negative for rash.  Neurological: Negative for dizziness, weakness and light-headedness.    Allergies  Review of patient's allergies indicates no known allergies.  Home Medications   Current Outpatient Rx  Name  Route  Sig  Dispense  Refill  . cyproheptadine (PERIACTIN) 4 MG tablet   Oral   Take 4 mg by mouth  3 (three) times daily as needed for allergies.          . fluconazole (DIFLUCAN) 150 MG tablet      Take 1 tablet by mouth, repeat after finishing course of metronidazole.   2 tablet   1   . meloxicam (MOBIC) 7.5 MG tablet   Oral   Take 1 tablet (7.5 mg total) by mouth daily.   15 tablet   0   . methocarbamol (ROBAXIN) 500 MG tablet   Oral   Take 1 tablet (500 mg total) by mouth 4 (four) times daily as needed for muscle spasms (or pain).   20 tablet   0   . metroNIDAZOLE (FLAGYL) 500 MG tablet   Oral   Take 1 tablet (500 mg total) by mouth 2 (two) times daily.   14 tablet   0   . Naproxen Sodium (ALEVE) 220 MG CAPS   Oral   Take 880 mg by mouth daily as needed (pain).          BP 128/81  Pulse 84  Temp(Src) 98.1 F (36.7 C) (Oral)  Resp 20  SpO2 100%  LMP 10/15/2012 Physical Exam  Nursing note and vitals reviewed. Constitutional: She is oriented to person, place, and time. Vital signs are normal. She appears well-developed and well-nourished. No distress.  HENT:  Head: Normocephalic and atraumatic.  Pulmonary/Chest: Effort normal. No respiratory distress.  Genitourinary: Cervix exhibits no motion tenderness, no discharge and no friability. Right adnexum displays no mass, no tenderness and no  fullness. Left adnexum displays no mass, no tenderness and no fullness. No erythema, tenderness or bleeding around the vagina. Vaginal discharge (Thin, white, clumpy) found.  Neurological: She is alert and oriented to person, place, and time. She has normal strength. Coordination normal.  Skin: Skin is warm and dry. No rash noted. She is not diaphoretic.  Psychiatric: She has a normal mood and affect. Judgment normal.    ED Course  Procedures (including critical care time) Labs Review Labs Reviewed  POCT URINALYSIS DIP (DEVICE) - Abnormal; Notable for the following:    Hgb urine dipstick MODERATE (*)    All other components within normal limits  POCT PREGNANCY, URINE   CERVICOVAGINAL ANCILLARY ONLY   Imaging Review No results found.    MDM   1. Vaginitis and vulvovaginitis    Treating for both BV and candidiasis.  F/U PRN  Meds ordered this encounter  Medications  . fluconazole (DIFLUCAN) 150 MG tablet    Sig: Take 1 tablet by mouth, repeat after finishing course of metronidazole.    Dispense:  2 tablet    Refill:  1    Order Specific Question:  Supervising Provider    Answer:  Clementeen Graham, S [id number]  . metroNIDAZOLE (FLAGYL) 500 MG tablet    Sig: Take 1 tablet (500 mg total) by mouth 2 (two) times daily.    Dispense:  14 tablet    Refill:  0    Order Specific Question:  Supervising Provider    Answer:  Clementeen Graham, Kathie Rhodes [[id number]]       Graylon Good, PA-C 12/13/12 [phone]

## 2012-12-14 NOTE — ED Provider Notes (Signed)
Medical screening examination/treatment/procedure(s) were performed by a resident physician or non-physician practitioner and as the supervising physician I was immediately available for consultation/collaboration.  Clementeen Graham, MD    Rodolph Bong, MD 12/14/12 [phone]

## 2013-01-01 ENCOUNTER — Emergency Department (HOSPITAL_COMMUNITY): Payer: Medicaid Other

## 2013-01-01 ENCOUNTER — Emergency Department (HOSPITAL_COMMUNITY)
Admission: EM | Admit: 2013-01-01 | Discharge: 2013-01-01 | Disposition: A | Discharge status: Home / Self Care | Payer: Medicaid Other | Attending: Emergency Medicine | Admitting: Emergency Medicine

## 2013-01-01 ENCOUNTER — Encounter (HOSPITAL_COMMUNITY): Payer: Self-pay | Admitting: Emergency Medicine

## 2013-01-01 ENCOUNTER — Emergency Department (INDEPENDENT_AMBULATORY_CARE_PROVIDER_SITE_OTHER)
Admission: EM | Admit: 2013-01-01 | Discharge: 2013-01-01 | Disposition: A | Discharge status: Other Acute Inpatient Hospital | Payer: Medicaid Other | Source: Home / Self Care | Attending: Family Medicine | Admitting: Family Medicine

## 2013-01-01 DIAGNOSIS — R209 Unspecified disturbances of skin sensation: Secondary | ICD-10-CM | POA: Insufficient documentation

## 2013-01-01 DIAGNOSIS — R531 Weakness: Secondary | ICD-10-CM

## 2013-01-01 DIAGNOSIS — R200 Anesthesia of skin: Secondary | ICD-10-CM

## 2013-01-01 DIAGNOSIS — Z3202 Encounter for pregnancy test, result negative: Secondary | ICD-10-CM | POA: Insufficient documentation

## 2013-01-01 DIAGNOSIS — R202 Paresthesia of skin: Secondary | ICD-10-CM

## 2013-01-01 DIAGNOSIS — R5381 Other malaise: Secondary | ICD-10-CM

## 2013-01-01 LAB — COMPREHENSIVE METABOLIC PANEL
AST: 24 U/L (ref 0–37)
Albumin: 3.8 g/dL (ref 3.5–5.2)
BUN: 10 mg/dL (ref 6–23)
CO2: 24 mEq/L (ref 19–32)
Chloride: 105 mEq/L (ref 96–112)
Creatinine, Ser: 0.76 mg/dL (ref 0.50–1.10)
GFR calc non Af Amer: >90 mL/min (ref >90)
Total Bilirubin: 0.5 mg/dL (ref 0.3–1.2)

## 2013-01-01 LAB — DIFFERENTIAL
Basophils Absolute: 0 10*3/uL (ref 0.0–0.1)
Lymphocytes Relative: 38 % (ref 12–46)
Monocytes Absolute: 0.5 10*3/uL (ref 0.1–1.0)
Monocytes Relative: 8 % (ref 3–12)
Neutro Abs: 3.4 10*3/uL (ref 1.7–7.7)

## 2013-01-01 LAB — POCT I-STAT, CHEM 8
Chloride: 105 mEq/L (ref 96–112)
Glucose, Bld: 83 mg/dL (ref 70–99)
HCT: 43 % (ref 36.0–46.0)
Hemoglobin: 14.6 g/dL (ref 12.0–15.0)
Sodium: 142 mEq/L (ref 135–145)
TCO2: 23 mmol/L (ref 0–100)

## 2013-01-01 LAB — URINALYSIS, ROUTINE W REFLEX MICROSCOPIC
Hgb urine dipstick: NEGATIVE
Ketones, ur: NEGATIVE mg/dL
Leukocytes, UA: NEGATIVE
Nitrite: NEGATIVE
Specific Gravity, Urine: 1.026 (ref 1.005–1.030)
Urobilinogen, UA: 1 mg/dL (ref 0.0–1.0)

## 2013-01-01 LAB — POCT I-STAT TROPONIN I: Troponin i, poc: 0 ng/mL (ref 0.00–0.08)

## 2013-01-01 LAB — RAPID URINE DRUG SCREEN, HOSP PERFORMED
Amphetamines: NOT DETECTED
Benzodiazepines: NOT DETECTED
Opiates: NOT DETECTED
Tetrahydrocannabinol: POSITIVE — AB

## 2013-01-01 LAB — CBC
HCT: 40.3 % (ref 36.0–46.0)
Hemoglobin: 14.4 g/dL (ref 12.0–15.0)
MCH: 31.5 pg (ref 26.0–34.0)
MCV: 88.2 fL (ref 78.0–100.0)
WBC: 6.5 10*3/uL (ref 4.0–10.5)

## 2013-01-01 LAB — APTT: aPTT: 28 seconds (ref 24–37)

## 2013-01-01 LAB — PREGNANCY, URINE: Preg Test, Ur: NEGATIVE

## 2013-01-01 LAB — PROTIME-INR: Prothrombin Time: 13 seconds (ref 11.6–15.2)

## 2013-01-01 NOTE — ED Provider Notes (Signed)
CSN: [id number]     Arrival date & time 01/01/13  1312 History   First MD Initiated Contact with Patient 01/01/13 1317     Chief Complaint  Patient presents with  . Code Stroke    HPI Pt was seen at 1310. Per pt, c/o sudden onset and gradual improvement of constant left sided face and arm "tingling" and "numbness" that began at 1058 today PTA while she was at work. Pt states she drove herself to [facility] for evaluation. [facility] called code stroke and she was transferred to the ED for further evaluation. Denies CP/palpitations, no SOB/cough, no abd pain, no N/V/D, no headache, no neck pain, no visual changes, no focal motor weakness, no ataxia, no slurred speech, no facial droop.    No past medical history on file.   Past Surgical History  Procedure Laterality Date  . Cesarean section    . Uterine ablation      History  Substance Use Topics  . Smoking status: Never Smoker   . Smokeless tobacco: Not on file  . Alcohol Use: No    Review of Systems ROS: Statement: All systems negative except as marked or noted in the HPI; Constitutional: Negative for fever and chills. ; ; Eyes: Negative for eye pain, redness and discharge. ; ; ENMT: Negative for ear pain, hoarseness, nasal congestion, sinus pressure and sore throat. ; ; Cardiovascular: Negative for chest pain, palpitations, diaphoresis, dyspnea and peripheral edema. ; ; Respiratory: Negative for cough, wheezing and stridor. ; ; Gastrointestinal: Negative for nausea, vomiting, diarrhea, abdominal pain, blood in stool, hematemesis, jaundice and rectal bleeding. . ; ; Genitourinary: Negative for dysuria, flank pain and hematuria. ; ; Musculoskeletal: Negative for back pain and neck pain. Negative for swelling and trauma.; ; Skin: Negative for pruritus, rash, abrasions, blisters, bruising and skin lesion.; ; Neuro: +paresthesias. Negative for headache, lightheadedness and neck stiffness. Negative for weakness, altered level of consciousness , altered  mental status, extremity weakness, involuntary movement, seizure and syncope.     Allergies  Review of patient's allergies indicates no known allergies.  Home Medications  No current outpatient prescriptions on file. BP 106/70  Pulse 70  Temp(Src) 98 F (36.7 C) (Oral)  Resp 23  SpO2 98% Physical Exam 1315: Physical examination:  Nursing notes reviewed; Vital signs and O2 SAT reviewed;  Constitutional: Well developed, Well nourished, Well hydrated, In no acute distress; Head:  Normocephalic, atraumatic; Eyes: EOMI, PERRL, No scleral icterus; ENMT: Mouth and pharynx normal, Mucous membranes moist; Neck: Supple, Full range of motion, No lymphadenopathy; Cardiovascular: Regular rate and rhythm, No murmur, rub, or gallop; Respiratory: Breath sounds clear & equal bilaterally, No rales, rhonchi, wheezes.  Speaking full sentences with ease, Normal respiratory effort/excursion; Chest: Nontender, Movement normal; Abdomen: Soft, Nontender, Nondistended, Normal bowel sounds; Genitourinary: No CVA tenderness; Extremities: Pulses normal, No tenderness, No edema, No calf edema or asymmetry.; Neuro: AA&Ox3, Major CN grossly intact.Speech clear.  No facial droop.  No nystagmus. Grips equal. Strength 5/5 equal bilat UE's and LE's.  DTR 2/4 equal bilat UE's and LE's. +mild subjective left facial and LUE decreased sensation, otherwise no gross sensory deficits.  Normal cerebellar testing bilat UE's (finger-nose) and LE's (heel-shin)..; Skin: Color normal, Warm, Dry.; Psych:  Affect flat, poor eye contact.    ED Course  Procedures  1320:  Pt arrived to ED from [facility] as code stroke. Neuro Dr. Roseanne Reno has evaluated pt, not a TPA candidate due to NIH score 1. Requests to complete workup while in  the ED and observe until 1530 (pt remains in TPA window until then).   1500:  T/C from Neuro Dr. Roseanne Reno, case discussed, including:  HPI, pertinent PM/SHx, VS/PE, dx testing, ED course and treatment:  Requests to order MRI  brain limited without contrast; if negative for acute CVA, pt can be discharged. MRI ordered.  1630:  Pt states her symptoms are gradually improving.  MRI pending. Sign out to Dr. Effie Shy.    EKG Interpretation    Date/Time:  Friday January 01 2013 13:38:08 EST Ventricular Rate:  75 PR Interval:  282 QRS Duration: 74 QT Interval:  385 QTC Calculation: 430 R Axis:   86 Text Interpretation:  Sinus rhythm with 1st degree A-V block Otherwise normal ECG When compared with ECG of 01/05/2009, No significant change was found Confirmed by [facility]  MD, Nicholos Johns [id number]) on 01/01/2013 2:07:31 PM            MDM  MDM Reviewed: previous chart, nursing note and vitals Reviewed previous: labs and ECG Interpretation: labs, ECG and x-ray     Results for orders placed during the hospital encounter of 01/01/13  ETHANOL      Result Value Range   Alcohol, Ethyl (B) <11  0 - 11 mg/dL  PROTIME-INR      Result Value Range   Prothrombin Time 13.0  11.6 - 15.2 seconds   INR 1.00  0.00 - 1.49  APTT      Result Value Range   aPTT 28  24 - 37 seconds  CBC      Result Value Range   WBC 6.5  4.0 - 10.5 K/uL   RBC 4.57  3.87 - 5.11 MIL/uL   Hemoglobin 14.4  12.0 - 15.0 g/dL   HCT 96.0  45.4 - 09.8 %   MCV 88.2  78.0 - 100.0 fL   MCH 31.5  26.0 - 34.0 pg   MCHC 35.7  30.0 - 36.0 g/dL   RDW 11.9  14.7 - 82.9 %   Platelets 238  150 - 400 K/uL  DIFFERENTIAL      Result Value Range   Neutrophils Relative % 53  43 - 77 %   Neutro Abs 3.4  1.7 - 7.7 K/uL   Lymphocytes Relative 38  12 - 46 %   Lymphs Abs 2.5  0.7 - 4.0 K/uL   Monocytes Relative 8  3 - 12 %   Monocytes Absolute 0.5  0.1 - 1.0 K/uL   Eosinophils Relative 1  0 - 5 %   Eosinophils Absolute 0.0  0.0 - 0.7 K/uL   Basophils Relative 0  0 - 1 %   Basophils Absolute 0.0  0.0 - 0.1 K/uL  COMPREHENSIVE METABOLIC PANEL      Result Value Range   Sodium 140  135 - 145 mEq/L   Potassium 3.9  3.5 - 5.1 mEq/L   Chloride 105  96 - 112 mEq/L    CO2 24  19 - 32 mEq/L   Glucose, Bld 84  70 - 99 mg/dL   BUN 10  6 - 23 mg/dL   Creatinine, Ser 5.62  0.50 - 1.10 mg/dL   Calcium 8.9  8.4 - 13.0 mg/dL   Total Protein 7.4  6.0 - 8.3 g/dL   Albumin 3.8  3.5 - 5.2 g/dL   AST 24  0 - 37 U/L   ALT 15  0 - 35 U/L   Alkaline Phosphatase 46  39 - 117 U/L  Total Bilirubin 0.5  0.3 - 1.2 mg/dL   GFR calc non Af Amer >90  >90 mL/min   GFR calc Af Amer >90  >90 mL/min  TROPONIN I      Result Value Range   Troponin I <0.30  <0.30 ng/mL  URINE RAPID DRUG SCREEN (HOSP PERFORMED)      Result Value Range   Opiates NONE DETECTED  NONE DETECTED   Cocaine NONE DETECTED  NONE DETECTED   Benzodiazepines NONE DETECTED  NONE DETECTED   Amphetamines NONE DETECTED  NONE DETECTED   Tetrahydrocannabinol POSITIVE (*) NONE DETECTED   Barbiturates NONE DETECTED  NONE DETECTED  URINALYSIS, ROUTINE W REFLEX MICROSCOPIC      Result Value Range   Color, Urine YELLOW  YELLOW   APPearance CLEAR  CLEAR   Specific Gravity, Urine 1.026  1.005 - 1.030   pH 8.0  5.0 - 8.0   Glucose, UA NEGATIVE  NEGATIVE mg/dL   Hgb urine dipstick NEGATIVE  NEGATIVE   Bilirubin Urine NEGATIVE  NEGATIVE   Ketones, ur NEGATIVE  NEGATIVE mg/dL   Protein, ur NEGATIVE  NEGATIVE mg/dL   Urobilinogen, UA 1.0  0.0 - 1.0 mg/dL   Nitrite NEGATIVE  NEGATIVE   Leukocytes, UA NEGATIVE  NEGATIVE  PREGNANCY, URINE      Result Value Range   Preg Test, Ur NEGATIVE  NEGATIVE  POCT I-STAT, CHEM 8      Result Value Range   Sodium 142  135 - 145 mEq/L   Potassium 3.7  3.5 - 5.1 mEq/L   Chloride 105  96 - 112 mEq/L   BUN 9  6 - 23 mg/dL   Creatinine, Ser 6.29  0.50 - 1.10 mg/dL   Glucose, Bld 83  70 - 99 mg/dL   Calcium, Ion 5.28  4.13 - 1.23 mmol/L   TCO2 23  0 - 100 mmol/L   Hemoglobin 14.6  12.0 - 15.0 g/dL   HCT 24.4  01.0 - 27.2 %  POCT I-STAT TROPONIN I      Result Value Range   Troponin i, poc 0.00  0.00 - 0.08 ng/mL   Comment 3            Ct Head Wo Contrast 01/01/2013    CLINICAL DATA:  Left arm numbness and tingling.  EXAM: CT HEAD WITHOUT CONTRAST  TECHNIQUE: Contiguous axial images were obtained from the base of the skull through the vertex without intravenous contrast.  COMPARISON:  04/29/2010  FINDINGS: There is no evidence of intra-axial nor extra-axial fluid collections. No evidence of acute hemorrhage. There is no evidence of mass effect. Ventricles and cisterns are patent. There is no evidence for depressed skull fracture. Visualized paranasal sinuses mastoid air cells are patent.  IMPRESSION: No evidence of focal acute intracranial abnormalities. Dr. Roseanne Reno of the Neurology service was informed of these findings at the time initial interpretation via telephone conversation.   Electronically Signed   By: Salome Holmes M.D.   On: 01/01/2013 13:37        Laray Anger, DO 01/01/13 [phone]

## 2013-01-01 NOTE — ED Notes (Signed)
Call to CN, advised of code stroke patient. Call to [employer], no truck available. CAlled [facility] regarding code stroke. O2 ,IV, monitor

## 2013-01-01 NOTE — ED Notes (Addendum)
C/o onset yest PM NV, this AM, had numbness and tingling left arm, arm numbness; denies pain in arm or chest. statesd she was last her normal self last PM, w her numbness onset this Am at work

## 2013-01-01 NOTE — ED Notes (Signed)
Pt reports that this morning while sitting at her desk she started to experience left arm numbness/tingling at appx 1058. Pt drove herself to [facility] where where she met criteria for code stroke.  Pt transferred from [facility] to ED by [facility], code stroke paged out at 1315.

## 2013-01-01 NOTE — Code Documentation (Signed)
42yo female arriving to [facility] via [facility] at 1312.  Patient reports that she did not feel well yesterday, went to bed, and woke up fine.  She went to work and at 1058 had sudden onset left arm numbness, tingling and weakness.  CT completed.  NIHSS 1, see documentation for details.  Patient continues to report numbness in the left arm.  Patient is slow to respond and initiate commands.  Dr. Roseanne Reno at bedside, no acute stroke treatment at this time.  Patient remains in the tPA window until 1530.  Bedside handoff with ED RN Brittney.

## 2013-01-01 NOTE — ED Notes (Addendum)
Per EMS: Pt drove herself to [facility] after experiencing left arm numbness/tingling starting at 1058 this morning. Pt reports N/V last night, but denies at this time. Pt AO x4. 134/80. 88 bpm. CBG 71.

## 2013-01-01 NOTE — ED Notes (Signed)
Pt anxious to leave. Explained to pt that we were waiting on MRI results, and that they just came back and she is waiting on MD to read them. MD Effie Shy made aware that pt is anxious to leave. Pt resting comfortably in bed.

## 2013-01-01 NOTE — ED Provider Notes (Signed)
CSN: [id number]     Arrival date & time 01/01/13  1224 History   First MD Initiated Contact with Patient 01/01/13 1248     Chief Complaint  Patient presents with  . Numbness   (Consider location/radiation/quality/duration/timing/severity/associated sxs/prior Treatment) Patient is a 42 y.o. female presenting with weakness. The history is provided by the patient. No language interpreter was used.  Weakness This is a new problem. The current episode started 1 to 2 hours ago. The problem occurs constantly. The problem has been gradually improving. Pertinent negatives include no chest pain. Nothing aggravates the symptoms. Nothing relieves the symptoms. She has tried nothing for the symptoms.  Pt reports he began having numbness to his left arm.  And tingling to left hand.   Pt reports face felt tingly but face is improving  History reviewed. No pertinent past medical history. Past Surgical History  Procedure Laterality Date  . Cesarean section    . Uterine ablation     History reviewed. No pertinent family history. History  Substance Use Topics  . Smoking status: Never Smoker   . Smokeless tobacco: Not on file  . Alcohol Use: No   OB History   Grav Para Term Preterm Abortions TAB SAB Ect Mult Living                 Review of Systems  Cardiovascular: Negative for chest pain.  Neurological: Positive for weakness and numbness.  All other systems reviewed and are negative.    Allergies  Review of patient's allergies indicates no known allergies.  Home Medications   Current Outpatient Rx  Name  Route  Sig  Dispense  Refill  . cyproheptadine (PERIACTIN) 4 MG tablet   Oral   Take 4 mg by mouth 3 (three) times daily as needed for allergies.          . fluconazole (DIFLUCAN) 150 MG tablet      Take 1 tablet by mouth, repeat after finishing course of metronidazole.   2 tablet   1   . meloxicam (MOBIC) 7.5 MG tablet   Oral   Take 1 tablet (7.5 mg total) by mouth daily.  15 tablet   0   . methocarbamol (ROBAXIN) 500 MG tablet   Oral   Take 1 tablet (500 mg total) by mouth 4 (four) times daily as needed for muscle spasms (or pain).   20 tablet   0   . metroNIDAZOLE (FLAGYL) 500 MG tablet   Oral   Take 1 tablet (500 mg total) by mouth 2 (two) times daily.   14 tablet   0   . Naproxen Sodium (ALEVE) 220 MG CAPS   Oral   Take 880 mg by mouth daily as needed (pain).          BP 128/77  Pulse 88  Temp(Src) 98.4 F (36.9 C) (Oral)  Resp 16  SpO2 100% Physical Exam  Nursing note and vitals reviewed. Constitutional: She is oriented to person, place, and time. She appears well-developed and well-nourished.  HENT:  Head: Normocephalic and atraumatic.  Right Ear: External ear normal.  Left Ear: External ear normal.  Eyes: Conjunctivae and EOM are normal. Pupils are equal, round, and reactive to light.  Neck: Normal range of motion. Neck supple.  Cardiovascular: Normal rate.   Pulmonary/Chest: Effort normal and breath sounds normal.  Abdominal: She exhibits no distension.  Musculoskeletal: Normal range of motion.  Neurological: She is alert and oriented to person, place, and time. No cranial  nerve deficit.  Decreased grip left hand,  Finger weakness  Skin: Skin is warm.  Psychiatric: She has a normal mood and affect.    ED Course  Procedures (including critical care time) Labs Review Labs Reviewed - No data to display Imaging Review No results found.  EKG Interpretation    Date/Time:    Ventricular Rate:    PR Interval:    QRS Duration:   QT Interval:    QTC Calculation:   R Axis:     Text Interpretation:             I called Dr. Bernette Mayers in ED.  Code stroke called,   Pt's symptoms are resolving  This may be a TIA.  MDM   1. Weakness       Elson Areas, PA-C 01/01/13 1256

## 2013-01-01 NOTE — ED Provider Notes (Signed)
17:45- patient is comfortable. She denies numbness. She wants to leave, to get her child from daycare. MRI is negative for CVA. Screening evaluation negative for signs of infection or metabolic instability. She is encouraged to followup with her PCP , next week.  Ct Head Wo Contrast  01/01/2013   CLINICAL DATA:  Left arm numbness and tingling.  EXAM: CT HEAD WITHOUT CONTRAST  TECHNIQUE: Contiguous axial images were obtained from the base of the skull through the vertex without intravenous contrast.  COMPARISON:  04/29/2010  FINDINGS: There is no evidence of intra-axial nor extra-axial fluid collections. No evidence of acute hemorrhage. There is no evidence of mass effect. Ventricles and cisterns are patent. There is no evidence for depressed skull fracture. Visualized paranasal sinuses mastoid air cells are patent.  IMPRESSION: No evidence of focal acute intracranial abnormalities. Dr. Roseanne Reno of the Neurology service was informed of these findings at the time initial interpretation via telephone conversation.   Electronically Signed   By: Salome Holmes M.D.   On: 01/01/2013 13:37   Mr Brain Wo Contrast  01/01/2013   CLINICAL DATA:  Code stroke. Left face and arm tingling and numbness.  EXAM: MRI HEAD WITHOUT CONTRAST  TECHNIQUE: Multiplanar, multiecho pulse sequences of the brain and surrounding structures were obtained without intravenous contrast.  COMPARISON:  CT head 01/01/2013  FINDINGS: Negative for acute infarct.  Several small white matter hyperintensities bilaterally with a chronic appearance. Brainstem and cerebellum are normal. Ventricle size is normal. Negative for hemorrhage or mass. No edema or midline shift.  Paranasal sinuses are clear.  Craniocervical junction is normal.  IMPRESSION: No acute abnormality. Small white matter hyperintensities consistent with mild chronic microvascular ischemia or migraine headache.   Electronically Signed   By: Marlan Palau M.D.   On: 01/01/2013 17:14    Flint Melter, MD 01/01/13 1752

## 2013-01-01 NOTE — Consult Note (Signed)
Reason for Consult: Numbness involving left face and upper extremity.  HPI:                                                                                                                                          Rhonda Rocha is an 42 y.o. female with no documented medical history presenting with new onset numbness and tingling involving left side of face and left arm. Onset was at 10:58 AM today. She has no previous history of similar symptoms. She has not been on antiplatelet therapy. CT scan of her head showed no acute intracranial abnormality. NIH stroke score was 1 for subjective numbness. Patient was not considered a candidate for TPA because of a mildness of her symptoms and findings.  No past medical history on file.  Past Surgical History  Procedure Laterality Date  . Cesarean section    . Uterine ablation      No family history on file.  Social History:  reports that she has never smoked. She does not have any smokeless tobacco history on file. She reports that she does not drink alcohol or use illicit drugs.  No Known Allergies  MEDICATIONS:                                                                                                                     I have reviewed the patient's current medications.   ROS:                                                                                                                                       History obtained from the patient  General ROS: negative for - chills, fatigue, fever, night sweats, weight gain or weight loss Psychological ROS: negative for - behavioral disorder, hallucinations, memory difficulties, mood swings or  suicidal ideation Ophthalmic ROS: negative for - blurry vision, double vision, eye pain or loss of vision ENT ROS: negative for - epistaxis, nasal discharge, oral lesions, sore throat, tinnitus or vertigo Allergy and Immunology ROS: negative for - hives or itchy/watery eyes Hematological and Lymphatic  ROS: negative for - bleeding problems, bruising or swollen lymph nodes Endocrine ROS: negative for - galactorrhea, hair pattern changes, polydipsia/polyuria or temperature intolerance Respiratory ROS: negative for - cough, hemoptysis, shortness of breath or wheezing Cardiovascular ROS: negative for - chest pain, dyspnea on exertion, edema or irregular heartbeat Gastrointestinal ROS: negative for - abdominal pain, diarrhea, hematemesis, nausea/vomiting or stool incontinence Genito-Urinary ROS: negative for - dysuria, hematuria, incontinence or urinary frequency/urgency Musculoskeletal ROS: negative for - joint swelling or muscular weakness Neurological ROS: as noted in HPI Dermatological ROS: negative for rash and skin lesion changes   Blood pressure 106/70, pulse 70, temperature 98 F (36.7 C), temperature source Oral, resp. rate 23, SpO2 98.00%.   Neurologic Examination:                                                                                                      Mental Status: Alert, oriented, thought content appropriate. Somewhat flat affect. Speech fluent without evidence of aphasia. Able to follow commands without difficulty. Cranial Nerves: II-Visual fields were normal. III/IV/VI-Pupils were equal and reacted. Extraocular movements were full and conjugate.    V/VII-slightly reduced perception of tactile sensation on left side of face compared to the right side; no facial weakness. VIII-normal. X-normal speech. Motor: Nonphysiologic drift of left upper and lower extremities with inconsistency, as well; normal muscle tone throughout. Sensory: Reduced perception of tactile sensation over left upper extremity compared to right upper extremity. Deep Tendon Reflexes: 2+ and symmetric. Plantars: Mute bilaterally Cerebellar: Normal finger-to-nose testing.  Lab Results  Component Value Date/Time   CHOL 186 03/16/2008  9:40 PM    Results for orders placed during the hospital  encounter of 01/01/13 (from the past 48 hour(s))  ETHANOL     Status: None   Collection Time    01/01/13  1:22 PM      Result Value Range   Alcohol, Ethyl (B) <11  0 - 11 mg/dL   Comment:            LOWEST DETECTABLE LIMIT FOR     SERUM ALCOHOL IS 11 mg/dL     FOR MEDICAL PURPOSES ONLY  PROTIME-INR     Status: None   Collection Time    01/01/13  1:22 PM      Result Value Range   Prothrombin Time 13.0  11.6 - 15.2 seconds   INR 1.00  0.00 - 1.49  APTT     Status: None   Collection Time    01/01/13  1:22 PM      Result Value Range   aPTT 28  24 - 37 seconds  CBC     Status: None   Collection Time    01/01/13  1:22 PM      Result Value Range   WBC 6.5  4.0 - 10.5 K/uL   RBC 4.57  3.87 - 5.11 MIL/uL   Hemoglobin 14.4  12.0 - 15.0 g/dL   HCT 16.1  09.6 - 04.5 %   MCV 88.2  78.0 - 100.0 fL   MCH 31.5  26.0 - 34.0 pg   MCHC 35.7  30.0 - 36.0 g/dL   RDW 40.9  81.1 - 91.4 %   Platelets 238  150 - 400 K/uL  DIFFERENTIAL     Status: None   Collection Time    01/01/13  1:22 PM      Result Value Range   Neutrophils Relative % 53  43 - 77 %   Neutro Abs 3.4  1.7 - 7.7 K/uL   Lymphocytes Relative 38  12 - 46 %   Lymphs Abs 2.5  0.7 - 4.0 K/uL   Monocytes Relative 8  3 - 12 %   Monocytes Absolute 0.5  0.1 - 1.0 K/uL   Eosinophils Relative 1  0 - 5 %   Eosinophils Absolute 0.0  0.0 - 0.7 K/uL   Basophils Relative 0  0 - 1 %   Basophils Absolute 0.0  0.0 - 0.1 K/uL  COMPREHENSIVE METABOLIC PANEL     Status: None   Collection Time    01/01/13  1:22 PM      Result Value Range   Sodium 140  135 - 145 mEq/L   Potassium 3.9  3.5 - 5.1 mEq/L   Chloride 105  96 - 112 mEq/L   CO2 24  19 - 32 mEq/L   Glucose, Bld 84  70 - 99 mg/dL   BUN 10  6 - 23 mg/dL   Creatinine, Ser 7.82  0.50 - 1.10 mg/dL   Calcium 8.9  8.4 - 95.6 mg/dL   Total Protein 7.4  6.0 - 8.3 g/dL   Albumin 3.8  3.5 - 5.2 g/dL   AST 24  0 - 37 U/L   ALT 15  0 - 35 U/L   Alkaline Phosphatase 46  39 - 117 U/L   Total  Bilirubin 0.5  0.3 - 1.2 mg/dL   GFR calc non Af Amer >90  >90 mL/min   GFR calc Af Amer >90  >90 mL/min   Comment: (NOTE)     The eGFR has been calculated using the CKD EPI equation.     This calculation has not been validated in all clinical situations.     eGFR's persistently <90 mL/min signify possible Chronic Kidney     Disease.  TROPONIN I     Status: None   Collection Time    01/01/13  1:22 PM      Result Value Range   Troponin I <0.30  <0.30 ng/mL   Comment:            Due to the release kinetics of cTnI,     a negative result within the first hours     of the onset of symptoms does not rule out     myocardial infarction with certainty.     If myocardial infarction is still suspected,     repeat the test at appropriate intervals.  POCT I-STAT TROPONIN I     Status: None   Collection Time    01/01/13  1:35 PM      Result Value Range   Troponin i, poc 0.00  0.00 - 0.08 ng/mL   Comment 3  Comment: Due to the release kinetics of cTnI,     a negative result within the first hours     of the onset of symptoms does not rule out     myocardial infarction with certainty.     If myocardial infarction is still suspected,     repeat the test at appropriate intervals.  POCT I-STAT, CHEM 8     Status: None   Collection Time    01/01/13  1:37 PM      Result Value Range   Sodium 142  135 - 145 mEq/L   Potassium 3.7  3.5 - 5.1 mEq/L   Chloride 105  96 - 112 mEq/L   BUN 9  6 - 23 mg/dL   Creatinine, Ser 6.57  0.50 - 1.10 mg/dL   Glucose, Bld 83  70 - 99 mg/dL   Calcium, Ion 8.46  9.62 - 1.23 mmol/L   TCO2 23  0 - 100 mmol/L   Hemoglobin 14.6  12.0 - 15.0 g/dL   HCT 95.2  84.1 - 32.4 %  URINE RAPID DRUG SCREEN (HOSP PERFORMED)     Status: Abnormal   Collection Time    01/01/13  1:47 PM      Result Value Range   Opiates NONE DETECTED  NONE DETECTED   Cocaine NONE DETECTED  NONE DETECTED   Benzodiazepines NONE DETECTED  NONE DETECTED   Amphetamines NONE DETECTED   NONE DETECTED   Tetrahydrocannabinol POSITIVE (*) NONE DETECTED   Barbiturates NONE DETECTED  NONE DETECTED   Comment:            DRUG SCREEN FOR MEDICAL PURPOSES     ONLY.  IF CONFIRMATION IS NEEDED     FOR ANY PURPOSE, NOTIFY LAB     WITHIN 5 DAYS.                LOWEST DETECTABLE LIMITS     FOR URINE DRUG SCREEN     Drug Class       Cutoff (ng/mL)     Amphetamine      1000     Barbiturate      200     Benzodiazepine   200     Tricyclics       300     Opiates          300     Cocaine          300     THC              50  URINALYSIS, ROUTINE W REFLEX MICROSCOPIC     Status: None   Collection Time    01/01/13  1:47 PM      Result Value Range   Color, Urine YELLOW  YELLOW   APPearance CLEAR  CLEAR   Specific Gravity, Urine 1.026  1.005 - 1.030   pH 8.0  5.0 - 8.0   Glucose, UA NEGATIVE  NEGATIVE mg/dL   Hgb urine dipstick NEGATIVE  NEGATIVE   Bilirubin Urine NEGATIVE  NEGATIVE   Ketones, ur NEGATIVE  NEGATIVE mg/dL   Protein, ur NEGATIVE  NEGATIVE mg/dL   Urobilinogen, UA 1.0  0.0 - 1.0 mg/dL   Nitrite NEGATIVE  NEGATIVE   Leukocytes, UA NEGATIVE  NEGATIVE   Comment: MICROSCOPIC NOT DONE ON URINES WITH NEGATIVE PROTEIN, BLOOD, LEUKOCYTES, NITRITE, OR GLUCOSE <1000 mg/dL.  PREGNANCY, URINE     Status: None   Collection Time    01/01/13  1:47  PM      Result Value Range   Preg Test, Ur NEGATIVE  NEGATIVE   Comment:            THE SENSITIVITY OF THIS     METHODOLOGY IS >20 mIU/mL.    Ct Head Wo Contrast  01/01/2013   CLINICAL DATA:  Left arm numbness and tingling.  EXAM: CT HEAD WITHOUT CONTRAST  TECHNIQUE: Contiguous axial images were obtained from the base of the skull through the vertex without intravenous contrast.  COMPARISON:  04/29/2010  FINDINGS: There is no evidence of intra-axial nor extra-axial fluid collections. No evidence of acute hemorrhage. There is no evidence of mass effect. Ventricles and cisterns are patent. There is no evidence for depressed skull  fracture. Visualized paranasal sinuses mastoid air cells are patent.  IMPRESSION: No evidence of focal acute intracranial abnormalities. Dr. Roseanne Reno of the Neurology service was informed of these findings at the time initial interpretation via telephone conversation.   Electronically Signed   By: Salome Holmes M.D.   On: 01/01/2013 13:37   Assessment/Plan: 42 year old lady presenting with sensory symptoms involving left face and upper extremity with no objective signs of acute stroke or TIA, as well as nonphysiologic features on neurological examination. I suspect psychophysiologic factors are contributing to this patient's presenting symptomatology.  Recommend limited MRI study without contrast to rule out remote likelihood of acute small subcortical right cerebral infarction. Admission stroke workup if MRI shows acute stroke. Otherwise, no further neurological intervention would be indicated.  Recommend aspirin 81 mg per day.  C.R. Roseanne Reno, MD [facility] [phone]  01/01/2013, 3:00 PM

## 2013-01-02 NOTE — ED Provider Notes (Signed)
Medical screening examination/treatment/procedure(s) were performed by a resident physician or non-physician practitioner and as the supervising physician I was immediately available for consultation/collaboration.  Evan Corey, MD    Evan S Corey, MD 01/02/13 1456 

## 2013-01-05 ENCOUNTER — Encounter (HOSPITAL_COMMUNITY): Payer: Self-pay | Admitting: Emergency Medicine

## 2013-01-05 ENCOUNTER — Emergency Department (HOSPITAL_COMMUNITY)
Admission: EM | Admit: 2013-01-05 | Discharge: 2013-01-05 | Disposition: A | Discharge status: Home / Self Care | Payer: Medicaid Other | Attending: Emergency Medicine | Admitting: Emergency Medicine

## 2013-01-05 ENCOUNTER — Emergency Department (HOSPITAL_COMMUNITY): Payer: Medicaid Other

## 2013-01-05 DIAGNOSIS — Z79899 Other long term (current) drug therapy: Secondary | ICD-10-CM | POA: Insufficient documentation

## 2013-01-05 DIAGNOSIS — R109 Unspecified abdominal pain: Secondary | ICD-10-CM | POA: Insufficient documentation

## 2013-01-05 DIAGNOSIS — N3090 Cystitis, unspecified without hematuria: Secondary | ICD-10-CM | POA: Insufficient documentation

## 2013-01-05 DIAGNOSIS — Z792 Long term (current) use of antibiotics: Secondary | ICD-10-CM | POA: Insufficient documentation

## 2013-01-05 DIAGNOSIS — Z3202 Encounter for pregnancy test, result negative: Secondary | ICD-10-CM | POA: Insufficient documentation

## 2013-01-05 DIAGNOSIS — N3091 Cystitis, unspecified with hematuria: Secondary | ICD-10-CM

## 2013-01-05 LAB — URINE MICROSCOPIC-ADD ON

## 2013-01-05 LAB — POCT PREGNANCY, URINE: Preg Test, Ur: NEGATIVE

## 2013-01-05 LAB — URINALYSIS, ROUTINE W REFLEX MICROSCOPIC
Ketones, ur: 15 mg/dL — AB
Protein, ur: 100 mg/dL — AB
Urobilinogen, UA: 0.2 mg/dL (ref 0.0–1.0)

## 2013-01-05 MED ORDER — PROMETHAZINE HCL 25 MG PO TABS: 25.0000 mg | ORAL_TABLET | Freq: Four times a day (QID) | ORAL | Status: DC | PRN

## 2013-01-05 MED ORDER — HYDROMORPHONE HCL PF 1 MG/ML IJ SOLN
1.0000 mg | Freq: Once | INTRAMUSCULAR | Status: AC
  Administered 2013-01-05: 1 mg via INTRAVENOUS
  Filled 2013-01-05: qty 1

## 2013-01-05 MED ORDER — SODIUM CHLORIDE 0.9 % IV SOLN
1000.0000 mL | Freq: Once | INTRAVENOUS | Status: AC
  Administered 2013-01-05: 1000 mL via INTRAVENOUS

## 2013-01-05 MED ORDER — FENTANYL CITRATE 0.05 MG/ML IJ SOLN
INTRAMUSCULAR | Status: AC
  Filled 2013-01-05: qty 2

## 2013-01-05 MED ORDER — CIPROFLOXACIN IN D5W 400 MG/200ML IV SOLN
400.0000 mg | Freq: Once | INTRAVENOUS | Status: AC
  Administered 2013-01-05: 400 mg via INTRAVENOUS
  Filled 2013-01-05: qty 200

## 2013-01-05 MED ORDER — PHENAZOPYRIDINE HCL 200 MG PO TABS: 200.0000 mg | ORAL_TABLET | Freq: Three times a day (TID) | ORAL | Status: DC

## 2013-01-05 MED ORDER — CIPROFLOXACIN HCL 500 MG PO TABS: 500.0000 mg | ORAL_TABLET | Freq: Two times a day (BID) | ORAL | Status: DC

## 2013-01-05 MED ORDER — FENTANYL CITRATE 0.05 MG/ML IJ SOLN
50.0000 ug | Freq: Once | INTRAMUSCULAR | Status: AC
  Administered 2013-01-05: 50 ug via INTRAVENOUS

## 2013-01-05 MED ORDER — KETOROLAC TROMETHAMINE 30 MG/ML IJ SOLN
30.0000 mg | Freq: Once | INTRAMUSCULAR | Status: AC
  Administered 2013-01-05: 30 mg via INTRAVENOUS
  Filled 2013-01-05: qty 1

## 2013-01-05 MED ORDER — ONDANSETRON HCL 4 MG/2ML IJ SOLN
4.0000 mg | Freq: Once | INTRAMUSCULAR | Status: AC
  Administered 2013-01-05: 4 mg via INTRAVENOUS
  Filled 2013-01-05: qty 2

## 2013-01-05 MED ORDER — PHENAZOPYRIDINE HCL 100 MG PO TABS
200.0000 mg | ORAL_TABLET | Freq: Three times a day (TID) | ORAL | Status: DC
  Administered 2013-01-05: 200 mg via ORAL
  Filled 2013-01-05: qty 2

## 2013-01-05 MED ORDER — HYDROCODONE-ACETAMINOPHEN 5-325 MG PO TABS: 2.0000 | ORAL_TABLET | ORAL | Status: DC | PRN

## 2013-01-05 NOTE — ED Notes (Signed)
Patient transported to CT 

## 2013-01-05 NOTE — ED Notes (Signed)
Patient states she started with pain in her right lower back and the pain radiated around to her pubic area

## 2013-01-05 NOTE — ED Provider Notes (Signed)
CSN: [id number]     Arrival date & time 01/05/13  0050 History   First MD Initiated Contact with Patient 01/05/13 0147     Chief Complaint  Patient presents with  . Hematuria   (Consider location/radiation/quality/duration/timing/severity/associated sxs/prior Treatment) HPI 42 yo female with complaint of right flank pain radiating into her right groin starting tonight while at work.  Pt reports blood in urine, cramping type pain.  No vaginal discharge, no pelvic pain, no recent change in sexual partners.  No recent urinary symptoms.  No history of kidney stones.  No fever, chills.  Some nausea, but no vomiting.   History reviewed. No pertinent past medical history. Past Surgical History  Procedure Laterality Date  . Cesarean section    . Uterine ablation     History reviewed. No pertinent family history. History  Substance Use Topics  . Smoking status: Never Smoker   . Smokeless tobacco: Not on file  . Alcohol Use: No   OB History   Grav Para Term Preterm Abortions TAB SAB Ect Mult Living                 Review of Systems  All other systems reviewed and are negative.    Allergies  Review of patient's allergies indicates no known allergies.  Home Medications   Current Outpatient Rx  Name  Route  Sig  Dispense  Refill  . ciprofloxacin (CIPRO) 500 MG tablet   Oral   Take 1 tablet (500 mg total) by mouth 2 (two) times daily.   14 tablet   0   . HYDROcodone-acetaminophen (NORCO/VICODIN) 5-325 MG per tablet   Oral   Take 2 tablets by mouth every 4 (four) hours as needed.   6 tablet   0   . phenazopyridine (PYRIDIUM) 200 MG tablet   Oral   Take 1 tablet (200 mg total) by mouth 3 (three) times daily.   6 tablet   0   . promethazine (PHENERGAN) 25 MG tablet   Oral   Take 1 tablet (25 mg total) by mouth every 6 (six) hours as needed for nausea.   30 tablet   0    BP 105/64  Pulse 71  Temp(Src) 98.3 F (36.8 C) (Oral)  Resp 18  SpO2 100%  LMP  12/21/2012 Physical Exam  Nursing note and vitals reviewed. Constitutional: She is oriented to person, place, and time. She appears well-developed and well-nourished. She appears distressed.  HENT:  Head: Normocephalic and atraumatic.  Nose: Nose normal.  Mouth/Throat: Oropharynx is clear and moist.  Eyes: Conjunctivae and EOM are normal. Pupils are equal, round, and reactive to light.  Neck: Normal range of motion. Neck supple. No JVD present. No tracheal deviation present. No thyromegaly present.  Cardiovascular: Normal rate, regular rhythm, normal heart sounds and intact distal pulses.  Exam reveals no gallop and no friction rub.   No murmur heard. Pulmonary/Chest: Effort normal and breath sounds normal. No stridor. No respiratory distress. She has no wheezes. She has no rales. She exhibits no tenderness.  Abdominal: Soft. Bowel sounds are normal. She exhibits no distension and no mass. There is tenderness (mild ttp suprapubic, right abd). There is no rebound and no guarding.  Right cva tenderness  Musculoskeletal: Normal range of motion. She exhibits no edema and no tenderness.  Lymphadenopathy:    She has no cervical adenopathy.  Neurological: She is alert and oriented to person, place, and time. She exhibits normal muscle tone. Coordination normal.  Skin:  Skin is warm and dry. No rash noted. No erythema. No pallor.  Psychiatric: She has a normal mood and affect. Her behavior is normal. Judgment and thought content normal.    ED Course  Procedures (including critical care time) Labs Review Labs Reviewed  URINALYSIS, ROUTINE W REFLEX MICROSCOPIC - Abnormal; Notable for the following:    Color, Urine RED (*)    APPearance CLOUDY (*)    Hgb urine dipstick LARGE (*)    Bilirubin Urine MODERATE (*)    Ketones, ur 15 (*)    Protein, ur 100 (*)    Leukocytes, UA LARGE (*)    All other components within normal limits  URINE MICROSCOPIC-ADD ON - Abnormal; Notable for the following:     Bacteria, UA FEW (*)    All other components within normal limits  URINE CULTURE  POCT PREGNANCY, URINE   Imaging Review Ct Abdomen Pelvis Wo Contrast  01/05/2013   CLINICAL DATA:  Hematuria and right flank pain. Evaluate for stones.  EXAM: CT ABDOMEN AND PELVIS WITHOUT CONTRAST  TECHNIQUE: Multidetector CT imaging of the abdomen and pelvis was performed following the standard protocol without intravenous contrast.  COMPARISON:  None.  FINDINGS: BODY WALL: Unremarkable.  LOWER CHEST: Unremarkable.  ABDOMEN/PELVIS:  Liver: No focal abnormality.  Biliary: No evidence of biliary obstruction or stone.  Pancreas: Unremarkable.  Spleen: Unremarkable.  Adrenals: Unremarkable.  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 No hydronephrosis or stone.  Bladder: Unremarkable.  Reproductive: Unremarkable.  Bowel: No obstruction. Normal appendix.  Retroperitoneum: No mass or adenopathy.  Peritoneum: Small volume free pelvic fluid, likely physiologic.  Vascular: No acute abnormality.  OSSEOUS: No acute abnormalities.  IMPRESSION: 1. No urolithiasis or hydronephrosis. 2. Free pelvic fluid, physiologic in volume.   Electronically Signed   By: Tiburcio Pea M.D.   On: 01/05/2013 03:51    EKG Interpretation   None       MDM   1. Hemorrhagic cystitis    42 yo female with acute onset of right flank pain, concerning for kidney stone.  No stone seen on CT scan, will treat for hemorrhagic cystitis.  Pt feeling better, no fevers, vomiting.  She has been given precautions for return.    Olivia Mackie, MD 01/05/13 [phone]

## 2013-01-05 NOTE — ED Notes (Signed)
Pt states at work, she started having right flank pain, states it got worse, abdomen started cramping. Pt states she went home, tried to use the bathroom, states a lot of pressure and it hurt so bad to urinate. Pt states shes also feeling cramping in lower pelvic area. Pt states urine looked light pink. Pt states it burned with urination.

## 2013-01-06 LAB — URINE CULTURE
Colony Count: NO GROWTH
Culture: NO GROWTH

## 2013-04-09 ENCOUNTER — Ambulatory Visit: Payer: Medicaid Other | Admitting: Physician Assistant

## 2013-04-09 DIAGNOSIS — Z0289 Encounter for other administrative examinations: Secondary | ICD-10-CM

## 2013-08-27 ENCOUNTER — Ambulatory Visit: Payer: Medicaid Other | Admitting: *Deleted

## 2014-04-12 ENCOUNTER — Emergency Department (INDEPENDENT_AMBULATORY_CARE_PROVIDER_SITE_OTHER)
Admission: EM | Admit: 2014-04-12 | Discharge: 2014-04-12 | Disposition: A | Discharge status: Home / Self Care | Payer: BLUE CROSS/BLUE SHIELD | Source: Home / Self Care | Attending: Family Medicine | Admitting: Family Medicine

## 2014-04-12 ENCOUNTER — Other Ambulatory Visit (HOSPITAL_COMMUNITY)
Admission: RE | Admit: 2014-04-12 | Discharge: 2014-04-12 | Disposition: A | Discharge status: Home / Self Care | Payer: BLUE CROSS/BLUE SHIELD | Source: Ambulatory Visit | Attending: Family Medicine | Admitting: Family Medicine

## 2014-04-12 ENCOUNTER — Encounter (HOSPITAL_COMMUNITY): Payer: Self-pay | Admitting: Emergency Medicine

## 2014-04-12 DIAGNOSIS — Z113 Encounter for screening for infections with a predominantly sexual mode of transmission: Secondary | ICD-10-CM | POA: Diagnosis not present

## 2014-04-12 DIAGNOSIS — N760 Acute vaginitis: Secondary | ICD-10-CM

## 2014-04-12 DIAGNOSIS — N3001 Acute cystitis with hematuria: Secondary | ICD-10-CM | POA: Diagnosis not present

## 2014-04-12 LAB — POCT URINALYSIS DIP (DEVICE)
BILIRUBIN URINE: NEGATIVE
Glucose, UA: NEGATIVE mg/dL
Ketones, ur: NEGATIVE mg/dL
Nitrite: NEGATIVE
PH: 6 (ref 5.0–8.0)
PROTEIN: NEGATIVE mg/dL
SPECIFIC GRAVITY, URINE: 1.015 (ref 1.005–1.030)
Urobilinogen, UA: 0.2 mg/dL (ref 0.0–1.0)

## 2014-04-12 LAB — POCT PREGNANCY, URINE: Preg Test, Ur: NEGATIVE

## 2014-04-12 LAB — CERVICOVAGINAL ANCILLARY ONLY
WET PREP (BD AFFIRM): NEGATIVE
Wet Prep (BD Affirm): NEGATIVE
Wet Prep (BD Affirm): NEGATIVE

## 2014-04-12 MED ORDER — METRONIDAZOLE 500 MG PO TABS: 500.0000 mg | ORAL_TABLET | Freq: Two times a day (BID) | ORAL | Status: DC

## 2014-04-12 MED ORDER — FLUCONAZOLE 150 MG PO TABS: 150.0000 mg | ORAL_TABLET | Freq: Once | ORAL | Status: DC

## 2014-04-12 MED ORDER — CEPHALEXIN 500 MG PO CAPS: 500.0000 mg | ORAL_CAPSULE | Freq: Two times a day (BID) | ORAL | Status: DC

## 2014-04-12 NOTE — ED Notes (Signed)
Blood draw comlpleted by livia, cma

## 2014-04-12 NOTE — ED Provider Notes (Signed)
Rhonda Rocha is a 44 y.o. female who presents to Urgent Care today for burning with urination present for the last 2 days. This is associated with bladder pressure frequency and urgency. She notes some vaginal itching intermittently after sex. She has not used any medications yet. She feels well otherwise. No abdominal pain fevers or chills.   No past medical history on file. Past Surgical History  Procedure Laterality Date  . Cesarean section    . Uterine ablation     History  Substance Use Topics  . Smoking status: Never Smoker   . Smokeless tobacco: Not on file  . Alcohol Use: No   ROS as above Medications: No current facility-administered medications for this encounter.   Current Outpatient Prescriptions  Medication Sig Dispense Refill  . ciprofloxacin (CIPRO) 500 MG tablet Take 1 tablet (500 mg total) by mouth 2 (two) times daily. 14 tablet 0  . HYDROcodone-acetaminophen (NORCO/VICODIN) 5-325 MG per tablet Take 2 tablets by mouth every 4 (four) hours as needed. 6 tablet 0  . phenazopyridine (PYRIDIUM) 200 MG tablet Take 1 tablet (200 mg total) by mouth 3 (three) times daily. 6 tablet 0  . promethazine (PHENERGAN) 25 MG tablet Take 1 tablet (25 mg total) by mouth every 6 (six) hours as needed for nausea. 30 tablet 0   No Known Allergies   Exam:  BP 129/86 mmHg  Pulse 75  Temp(Src) 98.4 F (36.9 C) (Oral)  Resp 16  SpO2 99% Gen: Well NAD HEENT: EOMI,  MMM Lungs: Normal work of breathing. CTABL Heart: RRR no MRG Abd: NABS, Soft. Nondistended, Nontender Exts: Brisk capillary refill, warm and well perfused.  GYN: Normal external genitalia. Vaginal canal with greenish discharge. Normal-appearing cervix. Nontender.  Results for orders placed or performed during the hospital encounter of 04/12/14 (from the past 24 hour(s))  POCT urinalysis dip (device)     Status: Abnormal   Collection Time: 04/12/14  8:56 AM  Result Value Ref Range   Glucose, UA NEGATIVE NEGATIVE mg/dL    Bilirubin Urine NEGATIVE NEGATIVE   Ketones, ur NEGATIVE NEGATIVE mg/dL   Specific Gravity, Urine 1.015 1.005 - 1.030   Hgb urine dipstick LARGE (A) NEGATIVE   pH 6.0 5.0 - 8.0   Protein, ur NEGATIVE NEGATIVE mg/dL   Urobilinogen, UA 0.2 0.0 - 1.0 mg/dL   Nitrite NEGATIVE NEGATIVE   Leukocytes, UA SMALL (A) NEGATIVE   No results found.  Assessment and Plan: 44 y.o. female with  1) urinary tract infection. Culture pending. Treat with Keflex 2) vaginitis. Unclear etiology. Urine cytology for gonorrhea Chlamydia trichomonas BV yeast pending. Serology for HIV and syphilis also pending. Treatment with Keflex, Flagyl, fluconazole.  Discussed warning signs or symptoms. Please see discharge instructions. Patient expresses understanding.     Gregor Hams, MD 04/12/14 [phone]

## 2014-04-12 NOTE — ED Notes (Signed)
Patient reports vaginal irritation, irritation

## 2014-04-12 NOTE — Discharge Instructions (Signed)
Thank you for coming in today. We will call you if anything comes back positive that is contagious If your belly pain worsens, or you have high fever, bad vomiting, blood in your stool or black tarry stool go to the Emergency Room.  Do not drink alcoholic taking these medications Follow-up with a primary care provider   Bacterial Vaginosis Bacterial vaginosis is a vaginal infection that occurs when the normal balance of bacteria in the vagina is disrupted. It results from an overgrowth of certain bacteria. This is the most common vaginal infection in women of childbearing age. Treatment is important to prevent complications, especially in pregnant women, as it can cause a premature delivery. CAUSES  Bacterial vaginosis is caused by an increase in harmful bacteria that are normally present in smaller amounts in the vagina. Several different kinds of bacteria can cause bacterial vaginosis. However, the reason that the condition develops is not fully understood. RISK FACTORS Certain activities or behaviors can put you at an increased risk of developing bacterial vaginosis, including:  Having a new sex partner or multiple sex partners.  Douching.  Using an intrauterine device (IUD) for contraception. Women do not get bacterial vaginosis from toilet seats, bedding, swimming pools, or contact with objects around them. SIGNS AND SYMPTOMS  Some women with bacterial vaginosis have no signs or symptoms. Common symptoms include:  Grey vaginal discharge.  A fishlike odor with discharge, especially after sexual intercourse.  Itching or burning of the vagina and vulva.  Burning or pain with urination. DIAGNOSIS  Your health care provider will take a medical history and examine the vagina for signs of bacterial vaginosis. A sample of vaginal fluid may be taken. Your health care provider will look at this sample under a microscope to check for bacteria and abnormal cells. A vaginal pH test may also be  done.  TREATMENT  Bacterial vaginosis may be treated with antibiotic medicines. These may be given in the form of a pill or a vaginal cream. A second round of antibiotics may be prescribed if the condition comes back after treatment.  HOME CARE INSTRUCTIONS   Only take over-the-counter or prescription medicines as directed by your health care provider.  If antibiotic medicine was prescribed, take it as directed. Make sure you finish it even if you start to feel better.  Do not have sex until treatment is completed.  Tell all sexual partners that you have a vaginal infection. They should see their health care provider and be treated if they have problems, such as a mild rash or itching.  Practice safe sex by using condoms and only having one sex partner. SEEK MEDICAL CARE IF:   Your symptoms are not improving after 3 days of treatment.  You have increased discharge or pain.  You have a fever. MAKE SURE YOU:   Understand these instructions.  Will watch your condition.  Will get help right away if you are not doing well or get worse. FOR MORE INFORMATION  Centers for Disease Control and Prevention, Division of STD Prevention: AppraiserFraud.fi American Sexual Health Association (ASHA): www.ashastd.org  Document Released: 01/07/2005 Document Revised: 10/28/2012 Document Reviewed: 08/19/2012 [facility] Patient Information 2015 [place], [place]. This information is not intended to replace advice given to you by your health care provider. Make sure you discuss any questions you have with your health care provider.   Urinary Tract Infection Urinary tract infections (UTIs) can develop anywhere along your urinary tract. Your urinary tract is your body's drainage system for removing  wastes and extra water. Your urinary tract includes two kidneys, two ureters, a bladder, and a urethra. Your kidneys are a pair of bean-shaped organs. Each kidney is about the size of your fist. They are located below  your ribs, one on each side of your spine. CAUSES Infections are caused by microbes, which are microscopic organisms, including fungi, viruses, and bacteria. These organisms are so small that they can only be seen through a microscope. Bacteria are the microbes that most commonly cause UTIs. SYMPTOMS  Symptoms of UTIs may vary by age and gender of the patient and by the location of the infection. Symptoms in young women typically include a frequent and intense urge to urinate and a painful, burning feeling in the bladder or urethra during urination. Older women and men are more likely to be tired, shaky, and weak and have muscle aches and abdominal pain. A fever may mean the infection is in your kidneys. Other symptoms of a kidney infection include pain in your back or sides below the ribs, nausea, and vomiting. DIAGNOSIS To diagnose a UTI, your caregiver will ask you about your symptoms. Your caregiver also will ask to provide a urine sample. The urine sample will be tested for bacteria and white blood cells. White blood cells are made by your body to help fight infection. TREATMENT  Typically, UTIs can be treated with medication. Because most UTIs are caused by a bacterial infection, they usually can be treated with the use of antibiotics. The choice of antibiotic and length of treatment depend on your symptoms and the type of bacteria causing your infection. HOME CARE INSTRUCTIONS  If you were prescribed antibiotics, take them exactly as your caregiver instructs you. Finish the medication even if you feel better after you have only taken some of the medication.  Drink enough water and fluids to keep your urine clear or pale yellow.  Avoid caffeine, tea, and carbonated beverages. They tend to irritate your bladder.  Empty your bladder often. Avoid holding urine for long periods of time.  Empty your bladder before and after sexual intercourse.  After a bowel movement, women should cleanse from  front to back. Use each tissue only once. SEEK MEDICAL CARE IF:   You have back pain.  You develop a fever.  Your symptoms do not begin to resolve within 3 days. SEEK IMMEDIATE MEDICAL CARE IF:   You have severe back pain or lower abdominal pain.  You develop chills.  You have nausea or vomiting.  You have continued burning or discomfort with urination. MAKE SURE YOU:   Understand these instructions.  Will watch your condition.  Will get help right away if you are not doing well or get worse. Document Released: 10/17/2004 Document Revised: 07/09/2011 Document Reviewed: 02/15/2011 [facility] Patient Information 2015 [place], [place]. This information is not intended to replace advice given to you by your health care provider. Make sure you discuss any questions you have with your health care provider.  PRIMARY CARE Paramedic at [place], [place] [phone]  Fax [phone]  Therapist, music at [facility] [address] [place], [place] [phone]  Fax [phone]  Therapist, music at [place] / [place] [phone] [address] [place], [place] [phone]  Fax [phone]  [facility] at [facility] [address], [place]  [place], [place]  Fax [phone]  [place] [address] [place], [place] [phone]  Fax [phone]  [facility] at [facility] [place], [place] [phone]  Fax [phone]   [place] @ [place] [address] Phone: [phone]   [place] @ [facility] [place]. [place] [address] Phone: [place] @ [place] [address] [place]  [address] Phone: [place] @ [place]. [place] [address] Phone: [place] @ [place]. [employer], [place] [address] Phone: [phone]   [place] @ [employer] [address] [place] [address] Phone: [phone]   Dr. Rachell Cipro [address] [place] [address] [facility] [address] [place], [place] [phone]  [facility] [place], [place] [phone]  [facility]: Delsa Bern MD [place], [place] [phone]  [facility] [place], [place] [phone]  [facility]: Dian Queen MD [address] [place], [place] [phone]  [facility]: Newton Pigg F MD [place], [place] [phone]  [facility] [address] [place], [place] [phone]   [facility]: [address] [place], [place] [address] [phone]

## 2014-04-13 LAB — RPR: RPR: NONREACTIVE

## 2014-04-13 LAB — CERVICOVAGINAL ANCILLARY ONLY
Chlamydia: NEGATIVE
Neisseria Gonorrhea: NEGATIVE

## 2014-04-13 LAB — HIV ANTIBODY (ROUTINE TESTING W REFLEX): HIV Screen 4th Generation wRfx: NONREACTIVE

## 2014-04-14 LAB — URINE CULTURE: SPECIAL REQUESTS: NORMAL

## 2014-04-14 NOTE — ED Notes (Signed)
GC/Chlamydia and Affirm neg., HIV/RPR non-reactive, Urine culture: 50,000 colonies E. Coli.  Pt. adequately treated with Keflex. Roselyn Meier 04/14/2014

## 2014-11-09 ENCOUNTER — Encounter (HOSPITAL_COMMUNITY): Payer: Self-pay | Admitting: Emergency Medicine

## 2014-11-09 ENCOUNTER — Emergency Department (HOSPITAL_COMMUNITY): Payer: BLUE CROSS/BLUE SHIELD

## 2014-11-09 ENCOUNTER — Emergency Department (HOSPITAL_COMMUNITY)
Admission: EM | Admit: 2014-11-09 | Discharge: 2014-11-10 | Disposition: A | Discharge status: Home / Self Care | Payer: BLUE CROSS/BLUE SHIELD | Attending: Emergency Medicine | Admitting: Emergency Medicine

## 2014-11-09 DIAGNOSIS — Z3202 Encounter for pregnancy test, result negative: Secondary | ICD-10-CM | POA: Diagnosis not present

## 2014-11-09 DIAGNOSIS — F419 Anxiety disorder, unspecified: Secondary | ICD-10-CM | POA: Insufficient documentation

## 2014-11-09 DIAGNOSIS — R079 Chest pain, unspecified: Secondary | ICD-10-CM | POA: Diagnosis present

## 2014-11-09 DIAGNOSIS — R002 Palpitations: Secondary | ICD-10-CM | POA: Diagnosis not present

## 2014-11-09 DIAGNOSIS — R109 Unspecified abdominal pain: Secondary | ICD-10-CM | POA: Diagnosis not present

## 2014-11-09 DIAGNOSIS — R0789 Other chest pain: Secondary | ICD-10-CM | POA: Insufficient documentation

## 2014-11-09 LAB — BASIC METABOLIC PANEL
ANION GAP: 8 (ref 5–15)
BUN: 8 mg/dL (ref 6–20)
CO2: 25 mmol/L (ref 22–32)
CREATININE: 0.82 mg/dL (ref 0.44–1.00)
Calcium: 8.5 mg/dL — ABNORMAL LOW (ref 8.9–10.3)
Chloride: 101 mmol/L (ref 101–111)
GFR calc Af Amer: >60 mL/min (ref >60)
GFR calc non Af Amer: >60 mL/min (ref >60)
Glucose, Bld: 129 mg/dL — ABNORMAL HIGH (ref 65–99)
POTASSIUM: 3.4 mmol/L — AB (ref 3.5–5.1)
Sodium: 134 mmol/L — ABNORMAL LOW (ref 135–145)

## 2014-11-09 LAB — I-STAT BETA HCG BLOOD, ED (MC, WL, AP ONLY): I-stat hCG, quantitative: <5 m[IU]/mL (ref <5)

## 2014-11-09 LAB — I-STAT TROPONIN, ED: Troponin i, poc: 0.01 ng/mL (ref 0.00–0.08)

## 2014-11-09 LAB — CBC
HCT: 35.2 % — ABNORMAL LOW (ref 36.0–46.0)
HEMOGLOBIN: 12.1 g/dL (ref 12.0–15.0)
MCH: 30.4 pg (ref 26.0–34.0)
MCHC: 34.4 g/dL (ref 30.0–36.0)
MCV: 88.4 fL (ref 78.0–100.0)
PLATELETS: 227 10*3/uL (ref 150–400)
RBC: 3.98 MIL/uL (ref 3.87–5.11)
RDW: 12.7 % (ref 11.5–15.5)
WBC: 6.4 10*3/uL (ref 4.0–10.5)

## 2014-11-09 MED ORDER — GI COCKTAIL ~~LOC~~
30.0000 mL | Freq: Once | ORAL | Status: AC
  Administered 2014-11-09: 30 mL via ORAL
  Filled 2014-11-09: qty 30

## 2014-11-09 NOTE — ED Notes (Signed)
Pt. reports central chest pain with emesis onset last week , states feels " nervous and shaking " , denies SOB and diaphoresis . No cough or congestion .

## 2014-11-09 NOTE — ED Provider Notes (Signed)
CSN: [id number]   Arrival date & time 11/09/14 2231  History  By signing my name below, I, Altamease Oiler, attest that this documentation has been prepared under the direction and in the presence of Julianne Rice, MD. Electronically Signed: Altamease Oiler, ED Scribe. 11/09/2014. 11:55 PM.  Chief Complaint  Patient presents with  . Chest Pain    HPI The history is provided by the patient. No language interpreter was used.   Rhonda Rocha is a 44 y.o. female who presents to the Emergency Department complaining of new and intermittent central chest pain with onset 5-6 days ago. She last had the pain 1 hour PTA and notes that it is worse with emotional upset. Also complains of feeling "jittery"/nervous, racing palpitations, cramping central abdominal pain, decreased appetite (states that she cannot eat even when she feels hungry), and insomnia. She associates her symptoms with increased stress due to "love" and "coming out of something".  Pt denies SOB, nausea, dysuria, increased frequency, and LE swelling. No new medication or frequent NSAID use. No personal or family history of DVT/PE. No recent travel. No family or personal history of cardiac disease. LNMP was 7-8 days ago.   History reviewed. No pertinent past medical history.  Past Surgical History  Procedure Laterality Date  . Cesarean section    . Uterine ablation      No family history on file.  Social History  Substance Use Topics  . Smoking status: Never Smoker   . Smokeless tobacco: None  . Alcohol Use: No     Review of Systems  Constitutional: Positive for appetite change. Negative for fever and chills.  Respiratory: Negative for shortness of breath.   Cardiovascular: Positive for chest pain and palpitations.  Gastrointestinal: Positive for abdominal pain. Negative for nausea, vomiting, diarrhea and constipation.  Genitourinary: Negative for dysuria, frequency and flank pain.  Musculoskeletal: Negative for myalgias,  neck pain and neck stiffness.  Skin: Negative for rash and wound.  Neurological: Negative for dizziness, weakness, light-headedness, numbness and headaches.  Psychiatric/Behavioral: Positive for sleep disturbance. The patient is nervous/anxious.   All other systems reviewed and are negative.  Home Medications   Prior to Admission medications   Medication Sig Start Date End Date Taking? Authorizing Provider  ALPRAZolam (XANAX) 0.25 MG tablet Take 1 tablet (0.25 mg total) by mouth 3 (three) times daily as needed for anxiety or sleep. 11/10/14   Julianne Rice, MD  cephALEXin (KEFLEX) 500 MG capsule Take 1 capsule (500 mg total) by mouth 2 (two) times daily. Patient not taking: Reported on 11/09/2014 04/12/14   Gregor Hams, MD  fluconazole (DIFLUCAN) 150 MG tablet Take 1 tablet (150 mg total) by mouth once. Patient not taking: Reported on 11/09/2014 04/12/14   Gregor Hams, MD  metroNIDAZOLE (FLAGYL) 500 MG tablet Take 1 tablet (500 mg total) by mouth 2 (two) times daily. Patient not taking: Reported on 11/09/2014 04/12/14   Gregor Hams, MD    Allergies  Review of patient's allergies indicates no known allergies.  Triage Vitals: BP 115/76 mmHg  Pulse 84  Temp(Src) 99.4 F (37.4 C)  Resp 20  Ht 5\' 7"  (1.702 m)  Wt 160 lb (72.576 kg)  BMI 25.05 kg/m2  SpO2 99%  LMP 10/26/2014 (Approximate)  Physical Exam  Constitutional: She is oriented to person, place, and time. She appears well-developed and well-nourished. No distress.  HENT:  Head: Normocephalic and atraumatic.  Mouth/Throat: Oropharynx is clear and moist. No oropharyngeal exudate.  Eyes: EOM  are normal. Pupils are equal, round, and reactive to light.  Neck: Normal range of motion. Neck supple. No thyromegaly present.  Cardiovascular: Normal rate and regular rhythm.  Exam reveals no gallop and no friction rub.   No murmur heard. Pulmonary/Chest: Effort normal and breath sounds normal. No respiratory distress. She has no  wheezes. She has no rales. She exhibits no tenderness.  Abdominal: Soft. Bowel sounds are normal. She exhibits no distension and no mass. There is tenderness (tenderness in the epigastric and left upper quadrants. No rebound or guarding.). There is no rebound and no guarding.  Musculoskeletal: Normal range of motion. She exhibits no edema or tenderness.  No CVA tenderness bilaterally. No lower extremity swelling or pain.  Lymphadenopathy:    She has no cervical adenopathy.  Neurological: She is alert and oriented to person, place, and time.  Patient is alert and oriented x3 with clear, goal oriented speech. Patient has 5/5 motor in all extremities. Sensation is intact to light touch. Bilateral finger-to-nose is normal with no signs of dysmetria. Patient has a normal gait and walks without assistance.  Skin: Skin is warm and dry. No rash noted. No erythema.  Psychiatric:  Flat affect. Depressed mood.  Nursing note and vitals reviewed.   ED Course  Procedures   DIAGNOSTIC STUDIES: Oxygen Saturation is 99% on RA, normal by my interpretation.    COORDINATION OF CARE: 11:24 PM Discussed treatment plan which includes lab work, CXR, EKG, and GI cocktail  with pt at bedside and pt agreed to plan.  Labs Reviewed  BASIC METABOLIC PANEL - Abnormal; Notable for the following:    Sodium 134 (*)    Potassium 3.4 (*)    Glucose, Bld 129 (*)    Calcium 8.5 (*)    All other components within normal limits  CBC - Abnormal; Notable for the following:    HCT 35.2 (*)    All other components within normal limits  HEPATIC FUNCTION PANEL - Abnormal; Notable for the following:    Alkaline Phosphatase 34 (*)    All other components within normal limits  URINALYSIS, ROUTINE W REFLEX MICROSCOPIC (NOT AT [place]) - Abnormal; Notable for the following:    APPearance CLOUDY (*)    Hgb urine dipstick TRACE (*)    All other components within normal limits  URINE MICROSCOPIC-ADD ON - Abnormal; Notable for the  following:    Bacteria, UA FEW (*)    All other components within normal limits  LIPASE, BLOOD  I-STAT TROPOININ, ED  I-STAT BETA HCG BLOOD, ED ([facility], [facility], [facility] ONLY)  I-STAT TROPOININ, ED    Imaging Review Dg Chest 2 View  11/09/2014  CLINICAL DATA:  Acute onset of mid chest pain.  Initial encounter. EXAM: CHEST  2 VIEW COMPARISON:  Thoracic spine radiographs performed 01/13/2008 FINDINGS: The lungs are well-aerated and clear. There is no evidence of focal opacification, pleural effusion or pneumothorax. The heart is normal in size; the mediastinal contour is within normal limits. No acute osseous abnormalities are seen. IMPRESSION: No acute cardiopulmonary process seen. Electronically Signed   By: Garald Balding M.D.   On: 11/09/2014 23:17    I personally reviewed and evaluated these images and lab results as a part of my medical decision-making.   EKG Interpretation  Date/Time:  Wednesday November 09 2014 22:42:09 EDT Ventricular Rate:  90 PR Interval:  236 QRS Duration: 70 QT Interval:  380 QTC Calculation: 464 R Axis:   95 Text Interpretation:  Sinus rhythm  with 1st degree A-V block Rightward axis Borderline ECG Confirmed by Lita Mains  MD, Phelicia Dantes ([id number]) on 11/09/2014 11:12:30 PM    MDM   Final diagnoses:  Atypical chest pain  Anxiety     I, Elio Haden, personally performed the services described in this documentation. All medical record entries made by the scribe were at my direction and in my presence.  I have reviewed the chart and discharge instructions and agree that the record reflects my personal performance and is accurate and complete. Camyra Vaeth.  11/10/2014. 2:55 AM.    Patient remained symptom-free in the emergency department. EKG without any evidence of ischemia. Troponin 2 is normal. She does not want to wait for thyroid studies. We'll discharge home. Given cardiology follow-up though low suspicion for coronary artery disease. Given prescription for  low-dose Xanax. Return precautions given.  Julianne Rice, MD 11/10/14 [phone]

## 2014-11-10 LAB — URINE MICROSCOPIC-ADD ON

## 2014-11-10 LAB — URINALYSIS, ROUTINE W REFLEX MICROSCOPIC
Bilirubin Urine: NEGATIVE
Glucose, UA: NEGATIVE mg/dL
Ketones, ur: NEGATIVE mg/dL
LEUKOCYTES UA: NEGATIVE
Nitrite: NEGATIVE
PROTEIN: NEGATIVE mg/dL
SPECIFIC GRAVITY, URINE: 1.022 (ref 1.005–1.030)
UROBILINOGEN UA: 1 mg/dL (ref 0.0–1.0)
pH: 7.5 (ref 5.0–8.0)

## 2014-11-10 LAB — HEPATIC FUNCTION PANEL
ALBUMIN: 3.6 g/dL (ref 3.5–5.0)
ALT: 15 U/L (ref 14–54)
AST: 25 U/L (ref 15–41)
Alkaline Phosphatase: 34 U/L — ABNORMAL LOW (ref 38–126)
BILIRUBIN TOTAL: 0.5 mg/dL (ref 0.3–1.2)
Bilirubin, Direct: 0.1 mg/dL (ref 0.1–0.5)
Indirect Bilirubin: 0.4 mg/dL (ref 0.3–0.9)
Total Protein: 6.7 g/dL (ref 6.5–8.1)

## 2014-11-10 LAB — LIPASE, BLOOD: Lipase: 32 U/L (ref 22–51)

## 2014-11-10 LAB — I-STAT TROPONIN, ED: Troponin i, poc: 0 ng/mL (ref 0.00–0.08)

## 2014-11-10 MED ORDER — ALPRAZOLAM 0.25 MG PO TABS: 0.2500 mg | ORAL_TABLET | Freq: Three times a day (TID) | ORAL | Status: DC | PRN

## 2014-11-10 NOTE — Discharge Instructions (Signed)
Nonspecific Chest Pain  °Chest pain can be caused by many different conditions. There is always a chance that your pain could be related to something serious, such as a heart attack or a blood clot in your lungs. Chest pain can also be caused by conditions that are not life-threatening. If you have chest pain, it is very important to follow up with your health care provider. °CAUSES  °Chest pain can be caused by: °· Heartburn. °· Pneumonia or bronchitis. °· Anxiety or stress. °· Inflammation around your heart (pericarditis) or lung (pleuritis or pleurisy). °· A blood clot in your lung. °· A collapsed lung (pneumothorax). It can develop suddenly on its own (spontaneous pneumothorax) or from trauma to the chest. °· Shingles infection (varicella-zoster virus). °· Heart attack. °· Damage to the bones, muscles, and cartilage that make up your chest wall. This can include: °· Bruised bones due to injury. °· Strained muscles or cartilage due to frequent or repeated coughing or overwork. °· Fracture to one or more ribs. °· Sore cartilage due to inflammation (costochondritis). °RISK FACTORS  °Risk factors for chest pain may include: °· Activities that increase your risk for trauma or injury to your chest. °· Respiratory infections or conditions that cause frequent coughing. °· Medical conditions or overeating that can cause heartburn. °· Heart disease or family history of heart disease. °· Conditions or health behaviors that increase your risk of developing a blood clot. °· Having had chicken pox (varicella zoster). °SIGNS AND SYMPTOMS °Chest pain can feel like: °· Burning or tingling on the surface of your chest or deep in your chest. °· Crushing, pressure, aching, or squeezing pain. °· Dull or sharp pain that is worse when you move, cough, or take a deep breath. °· Pain that is also felt in your back, neck, shoulder, or arm, or pain that spreads to any of these areas. °Your chest pain may come and go, or it may stay  constant. °DIAGNOSIS °Lab tests or other studies may be needed to find the cause of your pain. Your health care provider may have you take a test called an ambulatory ECG (electrocardiogram). An ECG records your heartbeat patterns at the time the test is performed. You may also have other tests, such as: °· Transthoracic echocardiogram (TTE). During echocardiography, sound waves are used to create a picture of all of the heart structures and to look at how blood flows through your heart. °· Transesophageal echocardiogram (TEE). This is a more advanced imaging test that obtains images from inside your body. It allows your health care provider to see your heart in finer detail. °· Cardiac monitoring. This allows your health care provider to monitor your heart rate and rhythm in real time. °· Holter monitor. This is a portable device that records your heartbeat and can help to diagnose abnormal heartbeats. It allows your health care provider to track your heart activity for several days, if needed. °· Stress tests. These can be done through exercise or by taking medicine that makes your heart beat more quickly. °· Blood tests. °· Imaging tests. °TREATMENT  °Your treatment depends on what is causing your chest pain. Treatment may include: °· Medicines. These may include: °· Acid blockers for heartburn. °· Anti-inflammatory medicine. °· Pain medicine for inflammatory conditions. °· Antibiotic medicine, if an infection is present. °· Medicines to dissolve blood clots. °· Medicines to treat coronary artery disease. °· Supportive care for conditions that do not require medicines. This may include: °· Resting. °· Applying heat   or cold packs to injured areas. °· Limiting activities until pain decreases. °HOME CARE INSTRUCTIONS °· If you were prescribed an antibiotic medicine, finish it all even if you start to feel better. °· Avoid any activities that bring on chest pain. °· Do not use any tobacco products, including  cigarettes, chewing tobacco, or electronic cigarettes. If you need help quitting, ask your health care provider. °· Do not drink alcohol. °· Take medicines only as directed by your health care provider. °· Keep all follow-up visits as directed by your health care provider. This is important. This includes any further testing if your chest pain does not go away. °· If heartburn is the cause for your chest pain, you may be told to keep your head raised (elevated) while sleeping. This reduces the chance that acid will go from your stomach into your esophagus. °· Make lifestyle changes as directed by your health care provider. These may include: °· Getting regular exercise. Ask your health care provider to suggest some activities that are safe for you. °· Eating a heart-healthy diet. A registered dietitian can help you to learn healthy eating options. °· Maintaining a healthy weight. °· Managing diabetes, if necessary. °· Reducing stress. °SEEK MEDICAL CARE IF: °· Your chest pain does not go away after treatment. °· You have a rash with blisters on your chest. °· You have a fever. °SEEK IMMEDIATE MEDICAL CARE IF:  °· Your chest pain is worse. °· You have an increasing cough, or you cough up blood. °· You have severe abdominal pain. °· You have severe weakness. °· You faint. °· You have chills. °· You have sudden, unexplained chest discomfort. °· You have sudden, unexplained discomfort in your arms, back, neck, or jaw. °· You have shortness of breath at any time. °· You suddenly start to sweat, or your skin gets clammy. °· You feel nauseous or you vomit. °· You suddenly feel light-headed or dizzy. °· Your heart begins to beat quickly, or it feels like it is skipping beats. °These symptoms may represent a serious problem that is an emergency. Do not wait to see if the symptoms will go away. Get medical help right away. Call your local emergency services (911 in the [place] Do not drive yourself to the hospital. °  °This  information is not intended to replace advice given to you by your health care provider. Make sure you discuss any questions you have with your health care provider. °  °Document Released: 10/17/2004 Document Revised: 01/28/2014 Document Reviewed: 08/13/2013 °Elsevier Interactive Patient Education ©2016 [employer] ° °Panic Attacks °Panic attacks are sudden, short-lived surges of severe anxiety, fear, or discomfort. They may occur for no reason when you are relaxed, when you are anxious, or when you are sleeping. Panic attacks may occur for a number of reasons:  °· Healthy people occasionally have panic attacks in extreme, life-threatening situations, such as war or natural disasters. Normal anxiety is a protective mechanism of the body that helps us react to danger (fight or flight response). °· Panic attacks are often seen with anxiety disorders, such as panic disorder, social anxiety disorder, generalized anxiety disorder, and phobias. Anxiety disorders cause excessive or uncontrollable anxiety. They may interfere with your relationships or other life activities. °· Panic attacks are sometimes seen with other mental illnesses, such as depression and posttraumatic stress disorder. °· Certain medical conditions, prescription medicines, and drugs of abuse can cause panic attacks. °SYMPTOMS  °Panic attacks start suddenly, peak within 20 minutes, and are   accompanied by four or more of the following symptoms: °· Pounding heart or fast heart rate (palpitations). °· Sweating. °· Trembling or shaking. °· Shortness of breath or feeling smothered. °· Feeling choked. °· Chest pain or discomfort. °· Nausea or strange feeling in your stomach. °· Dizziness, light-headedness, or feeling like you will faint. °· Chills or hot flushes. °· Numbness or tingling in your lips or hands and feet. °· Feeling that things are not real or feeling that you are not yourself. °· Fear of losing control or going crazy. °· Fear of dying. °Some  of these symptoms can mimic serious medical conditions. For example, you may think you are having a heart attack. Although panic attacks can be very scary, they are not life threatening. °DIAGNOSIS  °Panic attacks are diagnosed through an assessment by your health care provider. Your health care provider will ask questions about your symptoms, such as where and when they occurred. Your health care provider will also ask about your medical history and use of alcohol and drugs, including prescription medicines. Your health care provider may order blood tests or other studies to rule out a serious medical condition. Your health care provider may refer you to a mental health professional for further evaluation. °TREATMENT  °· Most healthy people who have one or two panic attacks in an extreme, life-threatening situation will not require treatment. °· The treatment for panic attacks associated with anxiety disorders or other mental illness typically involves counseling with a mental health professional, medicine, or a combination of both. Your health care provider will help determine what treatment is best for you. °· Panic attacks due to physical illness usually go away with treatment of the illness. If prescription medicine is causing panic attacks, talk with your health care provider about stopping the medicine, decreasing the dose, or substituting another medicine. °· Panic attacks due to alcohol or drug abuse go away with abstinence. Some adults need professional help in order to stop drinking or using drugs. °HOME CARE INSTRUCTIONS  °· Take all medicines as directed by your health care provider.   °· Schedule and attend follow-up visits as directed by your health care provider. It is important to keep all your appointments. °SEEK MEDICAL CARE IF: °· You are not able to take your medicines as prescribed. °· Your symptoms do not improve or get worse. °SEEK IMMEDIATE MEDICAL CARE IF:  °· You experience panic attack  symptoms that are different than your usual symptoms. °· You have serious thoughts about hurting yourself or others. °· You are taking medicine for panic attacks and have a serious side effect. °MAKE SURE YOU: °· Understand these instructions. °· Will watch your condition. °· Will get help right away if you are not doing well or get worse. °  °This information is not intended to replace advice given to you by your health care provider. Make sure you discuss any questions you have with your health care provider. °  °Document Released: 01/07/2005 Document Revised: 01/12/2013 Document Reviewed: 08/21/2012 °Elsevier Interactive Patient Education ©2016 [employer] Inc. ° °

## 2017-02-25 ENCOUNTER — Ambulatory Visit (HOSPITAL_COMMUNITY)
Admission: EM | Admit: 2017-02-25 | Discharge: 2017-02-25 | Disposition: A | Discharge status: Home / Self Care | Payer: BLUE CROSS/BLUE SHIELD | Attending: Emergency Medicine | Admitting: Emergency Medicine

## 2017-02-25 DIAGNOSIS — Z20828 Contact with and (suspected) exposure to other viral communicable diseases: Secondary | ICD-10-CM

## 2017-02-25 MED ORDER — OSELTAMIVIR PHOSPHATE 75 MG PO CAPS: 75.0000 mg | ORAL_CAPSULE | Freq: Every day | ORAL | 0 refills | Status: AC

## 2017-02-25 NOTE — Discharge Instructions (Signed)
Please take Tamiflu as needed for prophylaxis.

## 2017-02-25 NOTE — ED Provider Notes (Signed)
[place]    CSN: [id number] Arrival date & time: 02/25/17  1939     History   Chief Complaint No chief complaint on file.   HPI Rhonda Rocha is a 47 y.o. female presents to the urgent care facility with her daughter who was diagnosed with influenza.  Patient requesting prophylaxis.  She denies any current symptoms.  No fevers cough congestion runny nose.  No chest pain or shortness of breath.  No nausea vomiting or diarrhea.  HPI  No past medical history on file.  Patient Active Problem List   Diagnosis Date Noted  . Numbness and tingling 01/01/2013  . IRREGULAR MENSES 01/01/2010  . FOOT PAIN 03/24/2009  . PES PLANUS 03/16/2008  . HAIR LOSS 02/11/2008  . ACUTE CYSTITIS 01/04/2008  . DEPRESSION 10/22/2007  . MICROSCOPIC HEMATURIA 10/22/2007  . BREAST TENDERNESS 10/22/2007  . PERIPHERAL EDEMA 05/19/2007  . CANDIDIASIS, VAGINAL 03/02/2007  . VAGINAL DISCHARGE 03/02/2007  . LOSS OF APPETITE 08/30/2006    Past Surgical History:  Procedure Laterality Date  . CESAREAN SECTION    . Uterine Ablation      OB History    No data available       Home Medications    Prior to Admission medications   Medication Sig Start Date End Date Taking? Authorizing Provider  ALPRAZolam (XANAX) 0.25 MG tablet Take 1 tablet (0.25 mg total) by mouth 3 (three) times daily as needed for anxiety or sleep. 11/10/14   Julianne Rice, MD  cephALEXin (KEFLEX) 500 MG capsule Take 1 capsule (500 mg total) by mouth 2 (two) times daily. Patient not taking: Reported on 11/09/2014 04/12/14   Gregor Hams, MD  fluconazole (DIFLUCAN) 150 MG tablet Take 1 tablet (150 mg total) by mouth once. Patient not taking: Reported on 11/09/2014 04/12/14   Gregor Hams, MD  metroNIDAZOLE (FLAGYL) 500 MG tablet Take 1 tablet (500 mg total) by mouth 2 (two) times daily. Patient not taking: Reported on 11/09/2014 04/12/14   Gregor Hams, MD  oseltamivir (TAMIFLU) 75 MG capsule Take 1 capsule (75 mg  total) by mouth daily for 10 days. 02/25/17 03/07/17  Duanne Guess, PA-C  promethazine (PHENERGAN) 25 MG tablet Take 1 tablet (25 mg total) by mouth every 6 (six) hours as needed for nausea. 01/05/13 04/12/14  Linton Flemings, MD    Family History No family history on file.  Social History Social History   Tobacco Use  . Smoking status: Never Smoker  Substance Use Topics  . Alcohol use: No  . Drug use: Yes     Allergies   Patient has no known allergies.   Review of Systems Review of Systems  Constitutional: Negative for fever.  HENT: Negative for congestion.   Respiratory: Negative for cough.   Gastrointestinal: Negative for diarrhea, nausea and vomiting.  Skin: Negative for rash.     Physical Exam Triage Vital Signs ED Triage Vitals [02/25/17 1959]  Enc Vitals Group     BP 126/75     Pulse Rate 74     Resp 16     Temp 98 F (36.7 C)     Temp Source Oral     SpO2 98 %     Weight      Height      Head Circumference      Peak Flow      Pain Score      Pain Loc      Pain Edu?  Excl. in [place]?    No data found.  Updated Vital Signs BP 126/75   Pulse 74   Temp 98 F (36.7 C) (Oral)   Resp 16   SpO2 98%   Visual Acuity Right Eye Distance:   Left Eye Distance:   Bilateral Distance:    Right Eye Near:   Left Eye Near:    Bilateral Near:     Physical Exam  Constitutional: She appears well-developed and well-nourished.  HENT:  Head: Normocephalic and atraumatic.  Eyes: Conjunctivae are normal.  Cardiovascular: Normal rate.  Pulmonary/Chest: Effort normal. No respiratory distress.  Psychiatric: She has a normal mood and affect. Her behavior is normal.     UC Treatments / Results  Labs (all labs ordered are listed, but only abnormal results are displayed) Labs Reviewed - No data to display  EKG  EKG Interpretation None       Radiology No results found.  Procedures Procedures (including critical care time)  Medications Ordered in  UC Medications - No data to display   Initial Impression / Assessment and Plan / UC Course  I have reviewed the triage vital signs and the nursing notes.  Pertinent labs & imaging results that were available during my care of the patient were reviewed by me and considered in my medical decision making (see chart for details).     47 year old female who is the mother of the patient was diagnosed with influenza.  She is requesting postexposure prophylaxis.  Prescription is given.  Patient is educated on other ways to prevent exposure.  Final Clinical Impressions(s) / UC Diagnoses   Final diagnoses:  Exposure to influenza    ED Discharge Orders        Ordered    oseltamivir (TAMIFLU) 75 MG capsule  Daily     02/25/17 2002        Duanne Guess, [place] 02/25/17 2010

## 2017-10-21 ENCOUNTER — Encounter (HOSPITAL_COMMUNITY): Payer: Self-pay | Admitting: Emergency Medicine

## 2017-10-21 ENCOUNTER — Ambulatory Visit (HOSPITAL_COMMUNITY)
Admission: EM | Admit: 2017-10-21 | Discharge: 2017-10-21 | Disposition: A | Discharge status: Home / Self Care | Payer: BLUE CROSS/BLUE SHIELD | Attending: Family Medicine | Admitting: Family Medicine

## 2017-10-21 ENCOUNTER — Other Ambulatory Visit: Payer: Self-pay

## 2017-10-21 DIAGNOSIS — F329 Major depressive disorder, single episode, unspecified: Secondary | ICD-10-CM | POA: Insufficient documentation

## 2017-10-21 DIAGNOSIS — Z79899 Other long term (current) drug therapy: Secondary | ICD-10-CM | POA: Insufficient documentation

## 2017-10-21 DIAGNOSIS — N760 Acute vaginitis: Secondary | ICD-10-CM

## 2017-10-21 DIAGNOSIS — N898 Other specified noninflammatory disorders of vagina: Secondary | ICD-10-CM | POA: Insufficient documentation

## 2017-10-21 LAB — POCT URINALYSIS DIP (DEVICE)
Bilirubin Urine: NEGATIVE
Glucose, UA: NEGATIVE mg/dL
KETONES UR: NEGATIVE mg/dL
Leukocytes, UA: NEGATIVE
Nitrite: NEGATIVE
PROTEIN: NEGATIVE mg/dL
Specific Gravity, Urine: 1.02 (ref 1.005–1.030)
Urobilinogen, UA: 0.2 mg/dL (ref 0.0–1.0)
pH: 6.5 (ref 5.0–8.0)

## 2017-10-21 LAB — POCT PREGNANCY, URINE: PREG TEST UR: NEGATIVE

## 2017-10-21 MED ORDER — FLUCONAZOLE 150 MG PO TABS: 150.0000 mg | ORAL_TABLET | Freq: Once | ORAL | 0 refills | Status: AC

## 2017-10-21 NOTE — ED Provider Notes (Signed)
[facility]    CSN: [id number] Arrival date & time: 10/21/17  1846     History   Chief Complaint Chief Complaint  Patient presents with  . Vaginal Itching    HPI Manna DEVORY MCKINZIE is a 47 y.o. female.   Presenting with a 2-day history of vaginal irritation.  Denies any discharge.  Denies any dysuria.  She was sexually active 2 days ago and the condom broke.  She is unsure of the status of the partner.  Previously to that she had not sex for 3 years.  She is having significant irritation and burning.  She denies any abdominal pain.  Has not changed soaps and denies any rashes or bumps.  Does have a history of yeast infections.  HPI  History reviewed. No pertinent past medical history.  Patient Active Problem List   Diagnosis Date Noted  . Numbness and tingling 01/01/2013  . IRREGULAR MENSES 01/01/2010  . FOOT PAIN 03/24/2009  . PES PLANUS 03/16/2008  . HAIR LOSS 02/11/2008  . ACUTE CYSTITIS 01/04/2008  . DEPRESSION 10/22/2007  . MICROSCOPIC HEMATURIA 10/22/2007  . BREAST TENDERNESS 10/22/2007  . PERIPHERAL EDEMA 05/19/2007  . CANDIDIASIS, VAGINAL 03/02/2007  . VAGINAL DISCHARGE 03/02/2007  . LOSS OF APPETITE 08/30/2006    Past Surgical History:  Procedure Laterality Date  . CESAREAN SECTION    . Uterine Ablation      OB History   None      Home Medications    Prior to Admission medications   Medication Sig Start Date End Date Taking? Authorizing Provider  ALPRAZolam (XANAX) 0.25 MG tablet Take 1 tablet (0.25 mg total) by mouth 3 (three) times daily as needed for anxiety or sleep. 11/10/14   Julianne Rice, MD  cephALEXin (KEFLEX) 500 MG capsule Take 1 capsule (500 mg total) by mouth 2 (two) times daily. Patient not taking: Reported on 11/09/2014 04/12/14   Gregor Hams, MD  fluconazole (DIFLUCAN) 150 MG tablet Take 1 tablet (150 mg total) by mouth once. Patient not taking: Reported on 11/09/2014 04/12/14   Gregor Hams, MD  metroNIDAZOLE (FLAGYL)  500 MG tablet Take 1 tablet (500 mg total) by mouth 2 (two) times daily. Patient not taking: Reported on 11/09/2014 04/12/14   Gregor Hams, MD    Family History Family History  Problem Relation Age of Onset  . Healthy Mother     Social History Social History   Tobacco Use  . Smoking status: Never Smoker  Substance Use Topics  . Alcohol use: No  . Drug use: Never     Allergies   Patient has no known allergies.   Review of Systems Review of Systems  Constitutional: Negative for fever.  HENT: Negative for congestion.   Respiratory: Negative for cough.   Cardiovascular: Negative for chest pain.  Gastrointestinal: Negative for abdominal pain.  Genitourinary: Positive for vaginal pain. Negative for dysuria.  Musculoskeletal: Negative for gait problem.  Skin: Negative for color change.  Neurological: Negative for weakness.  Hematological: Negative for adenopathy.  Psychiatric/Behavioral: Negative for agitation.     Physical Exam Triage Vital Signs ED Triage Vitals  Enc Vitals Group     BP 10/21/17 1925 123/82     Pulse Rate 10/21/17 1925 78     Resp 10/21/17 1925 18     Temp 10/21/17 1925 98.4 F (36.9 C)     Temp Source 10/21/17 1925 Oral     SpO2 10/21/17 1925 100 %  Weight --      Height --      Head Circumference --      Peak Flow --      Pain Score 10/21/17 1928 10     Pain Loc --      Pain Edu? --      Excl. in [place]? --    No data found.  Updated Vital Signs BP 123/82 (BP Location: Left Arm)   Pulse 78   Temp 98.4 F (36.9 C) (Oral)   Resp 18   LMP 10/17/2017   SpO2 100%   Visual Acuity Right Eye Distance:   Left Eye Distance:   Bilateral Distance:    Right Eye Near:   Left Eye Near:    Bilateral Near:     Physical Exam  Constitutional: She appears well-developed and well-nourished.  HENT:  Head: Normocephalic and atraumatic.  Eyes: Conjunctivae and EOM are normal.  Neck: Normal range of motion. Neck supple.  Cardiovascular:  Normal rate and regular rhythm.  Pulmonary/Chest: Effort normal and breath sounds normal.  Abdominal: Soft. Bowel sounds are normal.  Genitourinary: Vagina normal.  Musculoskeletal: Normal range of motion.  Neurological: She is alert.  Skin: Skin is warm and dry.  Psychiatric: She has a normal mood and affect. Her behavior is normal.     UC Treatments / Results  Labs (all labs ordered are listed, but only abnormal results are displayed) Labs Reviewed  POCT URINALYSIS DIP (DEVICE) - Abnormal; Notable for the following components:      Result Value   Hgb urine dipstick MODERATE (*)    All other components within normal limits  WET PREP  (BD AFFIRM) ([place])  URINE CYTOLOGY ANCILLARY ONLY    EKG None  Radiology No results found.  Procedures Procedures (including critical care time)  Medications Ordered in UC Medications - No data to display  Initial Impression / Assessment and Plan / UC Course  I have reviewed the triage vital signs and the nursing notes.  Pertinent labs & imaging results that were available during my care of the patient were reviewed by me and considered in my medical decision making (see chart for details).     She is presenting with symptoms of vaginitis for 2 days.  Has not had any discharge on exam and has just finished her cycle.  No external lesions and urine pregnancy was normal.  Urinalysis only showed blood but has recently stopped her cycle.  Cytology swab was taken today.  She was provided with Diflucan x1.  Counseled that she will receive a phone call with results from today.  She will be treated based on any positive findings.  Given indications to follow-up with her primary doctor.   Final Clinical Impressions(s) / UC Diagnoses   Final diagnoses:  None   Discharge Instructions   None    ED Prescriptions    None     Controlled Substance Prescriptions [place] Controlled Substance Registry consulted? Not Applicable   Rosemarie Ax, MD 10/21/17 2115

## 2017-10-21 NOTE — ED Triage Notes (Signed)
Vaginal burning and itching, period just ended.  Not easy to tell if discharge.  Onset Monday of symptoms.  Patient had sex on Sunday and condom broke.

## 2017-10-21 NOTE — Discharge Instructions (Addendum)
Please try the medication that I am sending in. You receive a phone call with results from today in 1 to 2 days.

## 2017-10-21 NOTE — ED Notes (Signed)
Pt states she would like for her labs to go to [facility]. 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

## 2017-10-22 LAB — CERVICOVAGINAL ANCILLARY ONLY
BACTERIAL VAGINITIS: NEGATIVE
Candida vaginitis: POSITIVE — AB
Chlamydia: NEGATIVE
Neisseria Gonorrhea: NEGATIVE
TRICH (WINDOWPATH): NEGATIVE

## 2017-10-23 ENCOUNTER — Telehealth (HOSPITAL_COMMUNITY): Payer: Self-pay

## 2017-10-23 MED ORDER — FLUCONAZOLE 150 MG PO TABS: 150.0000 mg | ORAL_TABLET | Freq: Every day | ORAL | 0 refills | Status: AC

## 2017-10-23 NOTE — Telephone Encounter (Signed)
Candida (yeast) is positive.  Prescription for fluconazole was given at the urgent care visit.  Pt called and made aware. Recheck or followup with PCP for further evaluation if symptoms are not improving. Answered all questions.

## 2017-10-27 ENCOUNTER — Telehealth (HOSPITAL_COMMUNITY): Payer: Self-pay | Admitting: Family Medicine

## 2017-10-27 MED ORDER — FLUCONAZOLE 150 MG PO TABS: 150.0000 mg | ORAL_TABLET | Freq: Every day | ORAL | 0 refills | Status: DC

## 2017-10-27 NOTE — Telephone Encounter (Signed)
Spoke with patient on the phone and he reports she still having symptoms after taking 1 dose of Diflucan Another Diflucan sent to the pharmacy for treatment

## 2018-04-24 ENCOUNTER — Other Ambulatory Visit: Payer: Self-pay

## 2018-04-24 ENCOUNTER — Other Ambulatory Visit: Payer: Self-pay | Admitting: Family Medicine

## 2018-04-24 DIAGNOSIS — Z1231 Encounter for screening mammogram for malignant neoplasm of breast: Secondary | ICD-10-CM

## 2018-04-24 DIAGNOSIS — N644 Mastodynia: Secondary | ICD-10-CM

## 2018-04-30 ENCOUNTER — Other Ambulatory Visit: Payer: Self-pay

## 2018-05-01 ENCOUNTER — Ambulatory Visit
Admission: RE | Admit: 2018-05-01 | Discharge: 2018-05-01 | Disposition: A | Discharge status: Home / Self Care | Payer: Managed Care, Other (non HMO) | Source: Ambulatory Visit | Attending: Family Medicine | Admitting: Family Medicine

## 2018-05-01 ENCOUNTER — Other Ambulatory Visit: Payer: Self-pay

## 2018-05-01 DIAGNOSIS — N644 Mastodynia: Secondary | ICD-10-CM

## 2018-05-19 ENCOUNTER — Ambulatory Visit: Payer: Self-pay | Admitting: Surgery

## 2018-05-25 DIAGNOSIS — B009 Herpesviral infection, unspecified: Secondary | ICD-10-CM | POA: Insufficient documentation

## 2018-05-25 DIAGNOSIS — J302 Other seasonal allergic rhinitis: Secondary | ICD-10-CM | POA: Insufficient documentation

## 2018-08-17 ENCOUNTER — Encounter (HOSPITAL_BASED_OUTPATIENT_CLINIC_OR_DEPARTMENT_OTHER): Payer: Self-pay | Admitting: *Deleted

## 2018-08-17 ENCOUNTER — Other Ambulatory Visit: Payer: Self-pay

## 2018-08-18 ENCOUNTER — Encounter (HOSPITAL_BASED_OUTPATIENT_CLINIC_OR_DEPARTMENT_OTHER)
Admission: RE | Admit: 2018-08-18 | Discharge: 2018-08-18 | Disposition: A | Discharge status: Home / Self Care | Payer: Managed Care, Other (non HMO) | Source: Ambulatory Visit | Attending: Surgery | Admitting: Surgery

## 2018-08-18 ENCOUNTER — Other Ambulatory Visit (HOSPITAL_COMMUNITY)
Admission: RE | Admit: 2018-08-18 | Discharge: 2018-08-18 | Disposition: A | Discharge status: Home / Self Care | Payer: Managed Care, Other (non HMO) | Source: Ambulatory Visit | Attending: Surgery | Admitting: Surgery

## 2018-08-18 DIAGNOSIS — Z20828 Contact with and (suspected) exposure to other viral communicable diseases: Secondary | ICD-10-CM | POA: Diagnosis present

## 2018-08-18 DIAGNOSIS — Z01812 Encounter for preprocedural laboratory examination: Secondary | ICD-10-CM | POA: Insufficient documentation

## 2018-08-18 LAB — POCT PREGNANCY, URINE: Preg Test, Ur: NEGATIVE

## 2018-08-18 NOTE — Progress Notes (Signed)
Ensure pre surgery drink and surgical soap given with instructions, pt verbalized understanding. 

## 2018-08-19 LAB — SARS CORONAVIRUS 2 (TAT 6-24 HRS): SARS Coronavirus 2: NEGATIVE

## 2018-08-20 NOTE — H&P (Signed)
Rhonda Rocha Documented: 05/19/2018 10:15 AM Location: [facility] Patient #: [id number] DOB: 20-Jun-1970 Single / Language: Rhonda Rocha / Race: Black or African American Female   History of Present Illness Rhonda Key B. Hassell Done MD; 05/19/2018 10:39 AM) The patient is a 48 year old female who presents with a complaint of Breast problems. Left nipple still with scab and with tenderness. She is concerned and I discussed left breast biopsy involving the nipple. Will schedule LBB under general at [facility] as soon as we can get things scheduled.    Allergies (Rhonda A. Owens Shark, [place]; 05/19/2018 10:16 AM) No Known Drug Allergies  [05/06/2018]: Allergies Reconciled   Medication History (Rhonda A. Owens Shark, [place]; 05/19/2018 10:16 AM) Fluconazole (200MG  Tablet, Oral) Active. metroNIDAZOLE (500MG  Tablet, Oral) Active. Adult Gummy (Oral) Active. Medications Reconciled  Vitals (Rhonda Rocha RMA; 05/19/2018 10:16 AM) 05/19/2018 10:15 AM Weight: 172.8 lb Height: 67in Body Surface Area: 1.9 m Body Mass Index: 27.06 kg/m  Temp.: 97.36F  Pulse: 104 (Regular)  BP: 134/86(Sitting, Left Arm, Standard)       Physical Exam (Muneeb Veras B. Hassell Done MD; 05/19/2018 10:40 AM) Breast Note: Left nipple still with scab and tenderness; no redness. She has taken the Keflex and used neosporin.     Assessment & Plan Rhonda Key B. 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 Will schedule LBB at [facility] under general.

## 2018-08-21 ENCOUNTER — Ambulatory Visit (HOSPITAL_BASED_OUTPATIENT_CLINIC_OR_DEPARTMENT_OTHER): Payer: Managed Care, Other (non HMO) | Admitting: Anesthesiology

## 2018-08-21 ENCOUNTER — Ambulatory Visit (HOSPITAL_BASED_OUTPATIENT_CLINIC_OR_DEPARTMENT_OTHER)
Admission: RE | Admit: 2018-08-21 | Discharge: 2018-08-21 | Disposition: A | Discharge status: Home / Self Care | Payer: Managed Care, Other (non HMO) | Attending: Surgery | Admitting: Surgery

## 2018-08-21 ENCOUNTER — Other Ambulatory Visit: Payer: Self-pay

## 2018-08-21 ENCOUNTER — Encounter (HOSPITAL_BASED_OUTPATIENT_CLINIC_OR_DEPARTMENT_OTHER)
Admission: RE | Disposition: A | Discharge status: Home / Self Care | Payer: Self-pay | Source: Home / Self Care | Attending: Surgery

## 2018-08-21 ENCOUNTER — Encounter (HOSPITAL_BASED_OUTPATIENT_CLINIC_OR_DEPARTMENT_OTHER): Payer: Self-pay | Admitting: Anesthesiology

## 2018-08-21 DIAGNOSIS — N611 Abscess of the breast and nipple: Secondary | ICD-10-CM | POA: Diagnosis not present

## 2018-08-21 DIAGNOSIS — C50012 Malignant neoplasm of nipple and areola, left female breast: Secondary | ICD-10-CM | POA: Diagnosis not present

## 2018-08-21 DIAGNOSIS — N6489 Other specified disorders of breast: Secondary | ICD-10-CM | POA: Diagnosis present

## 2018-08-21 DIAGNOSIS — N641 Fat necrosis of breast: Secondary | ICD-10-CM | POA: Insufficient documentation

## 2018-08-21 HISTORY — PX: BREAST BIOPSY: SHX20

## 2018-08-21 HISTORY — DX: Malignant (primary) neoplasm, unspecified: C80.1

## 2018-08-21 SURGERY — BREAST BIOPSY
Anesthesia: General | Site: Breast | Laterality: Left

## 2018-08-21 MED ORDER — ONDANSETRON HCL 4 MG/2ML IJ SOLN
INTRAMUSCULAR | Status: DC | PRN
  Administered 2018-08-21: 4 mg via INTRAVENOUS

## 2018-08-21 MED ORDER — OXYCODONE HCL 5 MG PO TABS: 5.0000 mg | ORAL_TABLET | Freq: Once | ORAL | Status: DC | PRN

## 2018-08-21 MED ORDER — EPHEDRINE 5 MG/ML INJ
INTRAVENOUS | Status: AC
  Filled 2018-08-21: qty 10

## 2018-08-21 MED ORDER — BUPIVACAINE LIPOSOME 1.3 % IJ SUSP
INTRAMUSCULAR | Status: DC | PRN
  Administered 2018-08-21: 20 mL

## 2018-08-21 MED ORDER — DEXAMETHASONE SODIUM PHOSPHATE 10 MG/ML IJ SOLN
INTRAMUSCULAR | Status: AC
  Filled 2018-08-21: qty 1

## 2018-08-21 MED ORDER — ONDANSETRON HCL 4 MG/2ML IJ SOLN
INTRAMUSCULAR | Status: AC
  Filled 2018-08-21: qty 2

## 2018-08-21 MED ORDER — FENTANYL CITRATE (PF) 100 MCG/2ML IJ SOLN
50.0000 ug | INTRAMUSCULAR | Status: DC | PRN
  Administered 2018-08-21 (×2): 50 ug via INTRAVENOUS

## 2018-08-21 MED ORDER — LIDOCAINE 2% (20 MG/ML) 5 ML SYRINGE
INTRAMUSCULAR | Status: DC | PRN
  Administered 2018-08-21: 60 mg via INTRAVENOUS

## 2018-08-21 MED ORDER — CEFAZOLIN SODIUM-DEXTROSE 2-4 GM/100ML-% IV SOLN
2.0000 g | INTRAVENOUS | Status: AC
  Administered 2018-08-21: 2 g via INTRAVENOUS

## 2018-08-21 MED ORDER — ONDANSETRON HCL 4 MG/2ML IJ SOLN: 4.0000 mg | Freq: Once | INTRAMUSCULAR | Status: DC | PRN

## 2018-08-21 MED ORDER — SCOPOLAMINE 1 MG/3DAYS TD PT72: 1.0000 | MEDICATED_PATCH | Freq: Once | TRANSDERMAL | Status: DC

## 2018-08-21 MED ORDER — PROPOFOL 10 MG/ML IV BOLUS
INTRAVENOUS | Status: AC
  Filled 2018-08-21: qty 20

## 2018-08-21 MED ORDER — MEPERIDINE HCL 25 MG/ML IJ SOLN: 6.2500 mg | INTRAMUSCULAR | Status: DC | PRN

## 2018-08-21 MED ORDER — LIDOCAINE 2% (20 MG/ML) 5 ML SYRINGE
INTRAMUSCULAR | Status: AC
  Filled 2018-08-21: qty 5

## 2018-08-21 MED ORDER — FENTANYL CITRATE (PF) 100 MCG/2ML IJ SOLN
INTRAMUSCULAR | Status: AC
  Filled 2018-08-21: qty 2

## 2018-08-21 MED ORDER — FENTANYL CITRATE (PF) 100 MCG/2ML IJ SOLN: 25.0000 ug | INTRAMUSCULAR | Status: DC | PRN

## 2018-08-21 MED ORDER — CHLORHEXIDINE GLUCONATE CLOTH 2 % EX PADS: 6.0000 | MEDICATED_PAD | Freq: Once | CUTANEOUS | Status: DC

## 2018-08-21 MED ORDER — ACETAMINOPHEN 160 MG/5ML PO SOLN: 325.0000 mg | ORAL | Status: DC | PRN

## 2018-08-21 MED ORDER — KETOROLAC TROMETHAMINE 30 MG/ML IJ SOLN: 30.0000 mg | Freq: Once | INTRAMUSCULAR | Status: DC | PRN

## 2018-08-21 MED ORDER — PHENYLEPHRINE 40 MCG/ML (10ML) SYRINGE FOR IV PUSH (FOR BLOOD PRESSURE SUPPORT)
PREFILLED_SYRINGE | INTRAVENOUS | Status: DC | PRN
  Administered 2018-08-21: 120 ug via INTRAVENOUS
  Administered 2018-08-21: 80 ug via INTRAVENOUS

## 2018-08-21 MED ORDER — ACETAMINOPHEN 325 MG PO TABS: 325.0000 mg | ORAL_TABLET | ORAL | Status: DC | PRN

## 2018-08-21 MED ORDER — HYDROCODONE-ACETAMINOPHEN 5-325 MG PO TABS: 1.0000 | ORAL_TABLET | Freq: Four times a day (QID) | ORAL | 0 refills | Status: DC | PRN

## 2018-08-21 MED ORDER — PHENYLEPHRINE 40 MCG/ML (10ML) SYRINGE FOR IV PUSH (FOR BLOOD PRESSURE SUPPORT)
PREFILLED_SYRINGE | INTRAVENOUS | Status: AC
  Filled 2018-08-21: qty 10

## 2018-08-21 MED ORDER — DEXAMETHASONE SODIUM PHOSPHATE 4 MG/ML IJ SOLN
INTRAMUSCULAR | Status: DC | PRN
  Administered 2018-08-21: 10 mg via INTRAVENOUS

## 2018-08-21 MED ORDER — MIDAZOLAM HCL 2 MG/2ML IJ SOLN
1.0000 mg | INTRAMUSCULAR | Status: DC | PRN
  Administered 2018-08-21: 2 mg via INTRAVENOUS

## 2018-08-21 MED ORDER — BUPIVACAINE LIPOSOME 1.3 % IJ SUSP
INTRAMUSCULAR | Status: AC
  Filled 2018-08-21: qty 20

## 2018-08-21 MED ORDER — LACTATED RINGERS IV SOLN
INTRAVENOUS | Status: DC
  Administered 2018-08-21: 09:00:00 via INTRAVENOUS

## 2018-08-21 MED ORDER — PROPOFOL 10 MG/ML IV BOLUS
INTRAVENOUS | Status: DC | PRN
  Administered 2018-08-21: 150 mg via INTRAVENOUS

## 2018-08-21 MED ORDER — MIDAZOLAM HCL 2 MG/2ML IJ SOLN
INTRAMUSCULAR | Status: AC
  Filled 2018-08-21: qty 2

## 2018-08-21 MED ORDER — BUPIVACAINE LIPOSOME 1.3 % IJ SUSP: 20.0000 mL | Freq: Once | INTRAMUSCULAR | Status: DC

## 2018-08-21 MED ORDER — CEFAZOLIN SODIUM-DEXTROSE 2-4 GM/100ML-% IV SOLN
INTRAVENOUS | Status: AC
  Filled 2018-08-21: qty 100

## 2018-08-21 MED ORDER — OXYCODONE HCL 5 MG/5ML PO SOLN: 5.0000 mg | Freq: Once | ORAL | Status: DC | PRN

## 2018-08-21 SURGICAL SUPPLY — 45 items
BENZOIN TINCTURE PRP APPL 2/3 (GAUZE/BANDAGES/DRESSINGS) IMPLANT
BLADE SURG 15 STRL LF DISP TIS (BLADE) ×1 IMPLANT
BLADE SURG 15 STRL SS (BLADE) ×2
BNDG ELASTIC 6X5.8 VLCR STR LF (GAUZE/BANDAGES/DRESSINGS) IMPLANT
CANISTER SUCT 1200ML W/VALVE (MISCELLANEOUS) ×3 IMPLANT
CLOSURE WOUND 1/2 X4 (GAUZE/BANDAGES/DRESSINGS)
COVER BACK TABLE REUSABLE LG (DRAPES) ×3 IMPLANT
COVER MAYO STAND REUSABLE (DRAPES) ×3 IMPLANT
COVER WAND RF STERILE (DRAPES) IMPLANT
DECANTER SPIKE VIAL GLASS SM (MISCELLANEOUS) IMPLANT
DERMABOND ADVANCED (GAUZE/BANDAGES/DRESSINGS)
DERMABOND ADVANCED .7 DNX12 (GAUZE/BANDAGES/DRESSINGS) IMPLANT
DRAPE LAPAROTOMY 100X72 PEDS (DRAPES) ×3 IMPLANT
DRSG TEGADERM 4X4.75 (GAUZE/BANDAGES/DRESSINGS) ×3 IMPLANT
ELECT COATED BLADE 2.86 ST (ELECTRODE) ×3 IMPLANT
ELECT NEEDLE BLADE 2-5/6 (NEEDLE) ×3 IMPLANT
ELECT REM PT RETURN 9FT ADLT (ELECTROSURGICAL) ×3
ELECTRODE REM PT RTRN 9FT ADLT (ELECTROSURGICAL) ×1 IMPLANT
GLOVE BIO SURGEON STRL SZ8 (GLOVE) ×3 IMPLANT
GOWN STRL REUS W/ TWL LRG LVL3 (GOWN DISPOSABLE) ×1 IMPLANT
GOWN STRL REUS W/ TWL XL LVL3 (GOWN DISPOSABLE) ×1 IMPLANT
GOWN STRL REUS W/TWL LRG LVL3 (GOWN DISPOSABLE) ×2
GOWN STRL REUS W/TWL XL LVL3 (GOWN DISPOSABLE) ×2
NEEDLE HYPO 25X1 1.5 SAFETY (NEEDLE) ×3 IMPLANT
NS IRRIG 1000ML POUR BTL (IV SOLUTION) ×3 IMPLANT
PACK BASIN DAY SURGERY FS (CUSTOM PROCEDURE TRAY) ×3 IMPLANT
PENCIL BUTTON HOLSTER BLD 10FT (ELECTRODE) ×3 IMPLANT
SLEEVE SCD COMPRESS KNEE MED (MISCELLANEOUS) ×3 IMPLANT
SPONGE GAUZE 2X2 8PLY STER LF (GAUZE/BANDAGES/DRESSINGS) ×2
SPONGE GAUZE 2X2 8PLY STRL LF (GAUZE/BANDAGES/DRESSINGS) ×4 IMPLANT
STRIP CLOSURE SKIN 1/2X4 (GAUZE/BANDAGES/DRESSINGS) IMPLANT
SUCTION FRAZIER HANDLE 10FR (MISCELLANEOUS)
SUCTION TUBE FRAZIER 10FR DISP (MISCELLANEOUS) IMPLANT
SUT SILK 2 0 SH (SUTURE) IMPLANT
SUT VIC AB 4-0 SH 18 (SUTURE) ×3 IMPLANT
SUT VIC AB 5-0 P-3 18X BRD (SUTURE) ×1 IMPLANT
SUT VIC AB 5-0 P3 18 (SUTURE) ×2
SYR BULB 3OZ (MISCELLANEOUS) ×3 IMPLANT
SYR CONTROL 10ML LL (SYRINGE) ×3 IMPLANT
TOWEL GREEN STERILE FF (TOWEL DISPOSABLE) ×3 IMPLANT
TRAY DSU PREP LF (CUSTOM PROCEDURE TRAY) ×3 IMPLANT
TRAY FAXITRON CT DISP (TRAY / TRAY PROCEDURE) IMPLANT
TUBE CONNECTING 20'X1/4 (TUBING) ×1
TUBE CONNECTING 20X1/4 (TUBING) ×2 IMPLANT
YANKAUER SUCT BULB TIP NO VENT (SUCTIONS) IMPLANT

## 2018-08-21 NOTE — Anesthesia Postprocedure Evaluation (Signed)
Anesthesia Post Note  Patient: Rhonda Rocha  Procedure(s) Performed: LEFT BREAST BIOPSY (Left Breast)     Patient location during evaluation: Phase II Anesthesia Type: General Level of consciousness: awake Pain management: pain level controlled Vital Signs Assessment: post-procedure vital signs reviewed and stable Respiratory status: spontaneous breathing Cardiovascular status: stable Postop Assessment: no apparent nausea or vomiting Anesthetic complications: no    Last Vitals:  Vitals:   08/21/18 1100 08/21/18 1130  BP: 112/73 97/80  Pulse: 71 79  Resp: 17 16  Temp:  36.7 C  SpO2: 100% 100%    Last Pain:  Vitals:   08/21/18 1130  TempSrc: Oral  PainSc: 0-No pain   Pain Goal:                   Huston Foley

## 2018-08-21 NOTE — Interval H&P Note (Signed)
History and Physical Interval Note:  08/21/2018 9:24 AM  Rhonda Rocha  has presented today for surgery, with the diagnosis of PAGET'S DISEASE OF LEFT BREAST, CRUSTING AND DRAINAGE FROM NIPPLE.  The various methods of treatment have been discussed with the patient and family. After consideration of risks, benefits and other options for treatment, the patient has consented to  Procedure(s): LEFT BREAST BIOPSY (Left) as a surgical intervention.  The patient's history has been reviewed, patient examined, no change in status, stable for surgery.  I have reviewed the patient's chart and labs.  Questions were answered to the patient's satisfaction.     [name]

## 2018-08-21 NOTE — Discharge Instructions (Signed)
°  Post Anesthesia Home Care Instructions  Activity: Get plenty of rest for the remainder of the day. A responsible individual must stay with you for 24 hours following the procedure.  For the next 24 hours, DO NOT: -Drive a car -Operate machinery -Drink alcoholic beverages -Take any medication unless instructed by your physician -Make any legal decisions or sign important papers.  Meals: Start with liquid foods such as gelatin or soup. Progress to regular foods as tolerated. Avoid greasy, spicy, heavy foods. If nausea and/or vomiting occur, drink only clear liquids until the nausea and/or vomiting subsides. Call your physician if vomiting continues.  Special Instructions/Symptoms: Your throat may feel dry or sore from the anesthesia or the breathing tube placed in your throat during surgery. If this causes discomfort, gargle with warm salt water. The discomfort should disappear within 24 hours.  If you had a scopolamine patch placed behind your ear for the management of post- operative nausea and/or vomiting:  1. The medication in the patch is effective for 72 hours, after which it should be removed.  Wrap patch in a tissue and discard in the trash. Wash hands thoroughly with soap and water. 2. You may remove the patch earlier than 72 hours if you experience unpleasant side effects which may include dry mouth, dizziness or visual disturbances. 3. Avoid touching the patch. Wash your hands with soap and water after contact with the patch.    Information for Discharge Teaching: EXPAREL (bupivacaine liposome injectable suspension)   Your surgeon or anesthesiologist gave you EXPAREL(bupivacaine) to help control your pain after surgery.  EXPAREL is a local anesthetic that provides pain relief by numbing the tissue around the surgical site. EXPAREL is designed to release pain medication over time and can control pain for up to 72 hours. Depending on how you respond to EXPAREL, you may require  less pain medication during your recovery.  Possible side effects: Temporary loss of sensation or ability to move in the area where bupivacaine was injected. Nausea, vomiting, constipation Rarely, numbness and tingling in your mouth or lips, lightheadedness, or anxiety may occur. Call your doctor right away if you think you may be experiencing any of these sensations, or if you have other questions regarding possible side effects.  Follow all other discharge instructions given to you by your surgeon or nurse. Eat a healthy diet and drink plenty of water or other fluids.  If you return to the hospital for any reason within 96 hours following the administration of EXPAREL, it is important for health care providers to know that you have received this anesthetic. A teal colored band has been placed on your arm with the date, time and amount of EXPAREL you have received in order to alert and inform your health care providers. Please leave this armband in place for the full 96 hours following administration, and then you may remove the band.  

## 2018-08-21 NOTE — Transfer of Care (Signed)
Immediate Anesthesia Transfer of Care Note  Patient: Rhonda Rocha  Procedure(s) Performed: LEFT BREAST BIOPSY (Left )  Patient Location: PACU  Anesthesia Type:General  Level of Consciousness: drowsy  Airway & Oxygen Therapy: Patient Spontanous Breathing and Patient connected to face mask oxygen  Post-op Assessment: Report given to RN and Post -op Vital signs reviewed and stable  Post vital signs: Reviewed and stable  Last Vitals:  Vitals Value Taken Time  BP 99/69 08/21/18 1021  Temp    Pulse 77 08/21/18 1023  Resp 17 08/21/18 1023  SpO2 100 % 08/21/18 1023  Vitals shown include unvalidated device data.  Last Pain:  Vitals:   08/21/18 0838  TempSrc: Oral  PainSc: 0-No pain         Complications: No apparent anesthesia complications

## 2018-08-21 NOTE — Anesthesia Procedure Notes (Signed)
Procedure Name: LMA Insertion Date/Time: 08/21/2018 9:37 AM Performed by: Lieutenant Diego, CRNA Pre-anesthesia Checklist: Patient identified, Emergency Drugs available, Suction available and Patient being monitored Patient Re-evaluated:Patient Re-evaluated prior to induction Oxygen Delivery Method: Circle system utilized Preoxygenation: Pre-oxygenation with 100% oxygen Induction Type: IV induction Ventilation: Mask ventilation without difficulty LMA: LMA inserted LMA Size: 4.0 Number of attempts: 1 Placement Confirmation: positive ETCO2 and breath sounds checked- equal and bilateral Tube secured with: Tape Dental Injury: Teeth and Oropharynx as per pre-operative assessment

## 2018-08-21 NOTE — Op Note (Signed)
Rhonda Rocha  Jul 10, 1970 21 August 2018    PCP:  Patient, No Pcp Per   Surgeon: Kaylyn Lim, MD, FACS  Asst:  none  Anes:  general  Preop Dx: Nipple erosions with bleeding, scabbing -rule out Paget's disease Postop Dx: Path pending  Procedure: Left breast biopsy Location Surgery: CDS  8 Complications: None noted  EBL:   minimal cc  Drains: none  Description of Procedure:  The patient was taken to OR 8 .  After anesthesia was administered and the patient was prepped  with Technicare and a timeout was performed.  The scab was removed from the left nipple.  Two ellipse wedges were removed from the 3 oclock and the 9 oclock positions.  This was carried down into the nipple to include terminal ducts.  20 cc Exparel injected retroareolar.  Closed with 5-0 monocryl.   The patient tolerated the procedure well and was taken to the PACU in stable condition.     Matt B. Hassell Done, [place], [facility], [place]

## 2018-08-21 NOTE — Anesthesia Preprocedure Evaluation (Addendum)
Anesthesia Evaluation  Patient identified by MRN, date of birth, ID band Patient awake    Reviewed: Allergy & Precautions, NPO status , Patient's Chart, lab work & pertinent test results  Airway Mallampati: I       Dental no notable dental hx. (+) Teeth Intact   Pulmonary neg pulmonary ROS,    Pulmonary exam normal breath sounds clear to auscultation       Cardiovascular negative cardio ROS Normal cardiovascular exam Rhythm:Regular Rate:Normal     Neuro/Psych PSYCHIATRIC DISORDERS Depression negative neurological ROS     GI/Hepatic negative GI ROS, Neg liver ROS,   Endo/Other  negative endocrine ROS  Renal/GU negative Renal ROS  negative genitourinary   Musculoskeletal negative musculoskeletal ROS (+)   Abdominal Normal abdominal exam  (+)   Peds  Hematology negative hematology ROS (+)   Anesthesia Other Findings   Reproductive/Obstetrics negative OB ROS                             Anesthesia Physical Anesthesia Plan  ASA: II  Anesthesia Plan: General   Post-op Pain Management:    Induction: Intravenous  PONV Risk Score and Plan: 4 or greater and Ondansetron, Dexamethasone, Midazolam and Scopolamine patch - Pre-op  Airway Management Planned: LMA  Additional Equipment:   Intra-op Plan:   Post-operative Plan: Extubation in OR  Informed Consent: I have reviewed the patients History and Physical, chart, labs and discussed the procedure including the risks, benefits and alternatives for the proposed anesthesia with the patient or authorized representative who has indicated his/her understanding and acceptance.     Dental advisory given  Plan Discussed with: CRNA  Anesthesia Plan Comments:         Anesthesia Quick Evaluation

## 2018-08-24 ENCOUNTER — Encounter (HOSPITAL_BASED_OUTPATIENT_CLINIC_OR_DEPARTMENT_OTHER): Payer: Self-pay | Admitting: Surgery

## 2018-08-25 ENCOUNTER — Other Ambulatory Visit: Payer: Self-pay | Admitting: Surgery

## 2018-08-25 DIAGNOSIS — D0512 Intraductal carcinoma in situ of left breast: Secondary | ICD-10-CM

## 2018-09-01 ENCOUNTER — Telehealth: Payer: Self-pay | Admitting: Hematology and Oncology

## 2018-09-01 NOTE — Telephone Encounter (Signed)
Received a new patient referral from Dr. Hassell Done at [facility] for Rhonda Rocha. Rhonda Rocha has been cld and scheduled to see Rhonda Rocha on 8/19 at 345pm. She has been made aware to arrive 15 minutes early.

## 2018-09-03 ENCOUNTER — Ambulatory Visit
Admission: RE | Admit: 2018-09-03 | Discharge: 2018-09-03 | Disposition: A | Discharge status: Home / Self Care | Payer: Managed Care, Other (non HMO) | Source: Ambulatory Visit | Attending: Surgery | Admitting: Surgery

## 2018-09-03 ENCOUNTER — Other Ambulatory Visit: Payer: Self-pay

## 2018-09-03 DIAGNOSIS — D0512 Intraductal carcinoma in situ of left breast: Secondary | ICD-10-CM

## 2018-09-03 MED ORDER — GADOBUTROL 1 MMOL/ML IV SOLN
8.0000 mL | Freq: Once | INTRAVENOUS | Status: AC | PRN
  Administered 2018-09-03: 8 mL via INTRAVENOUS

## 2018-09-07 NOTE — Progress Notes (Signed)
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  Surgery date has not been set yet.  Did patient present with symptoms (if so, please note symptoms) or was this found on screening mammography?: She presented with left nipple pain and skin changes.  She described this as the nipple crusting over, the skin sloughing, and the nipple being raw thereafter.  MRI Breast 09/03/2018: Abnormal asymmetric enhancement of the left nipple.  Oval circumscribed enhancing mass within the lower inner quadrant of the LEFT breast, located just below the skin, measuring 5 mm, suspected intramammary lymph node. No correlate seen on patient's recent diagnostic mammogram.  No evidence of malignancy elsewhere within the LEFT breast.  Diagnostic mammogram/TOMO 05/01/2018: No mammographic or sonographic evidence of malignancy involving the left breast.  No mammographic evidence of malignancy involving the right breast.  There is a small focus of raw skin involving the nipple at the 8 o'clock location.  I was able to express a minimal clear discharge which emanated from at least 3 duct orifices.   Histology per Pathology Report: Left Breast 08/21/2018  Receptor Status: ER(), PR (), Her2-neu (), Ki-()    Past/Anticipated interventions by surgeon, if any: Dr. 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 -I explained to her, will ask for consult with Dr. Lindi Adie. -MRI breast -I explained nipple and areolar resection and then postop RT. -I will see her after breast MRI.    Past/Anticipated interventions by medical oncology, if any: Chemotherapy  Dr. Lindi Adie 09/09/2018 3:45   Lymphedema issues, if any: No  Pain issues, if any:  Some nipple soreness  SAFETY ISSUES:  Prior radiation? No  Pacemaker/ICD? No  Possible current pregnancy? No  Is the patient on methotrexate? No  Current  Complaints / other details:      Cori Razor, RN 09/07/2018,10:36 AM

## 2018-09-08 ENCOUNTER — Encounter: Payer: Self-pay | Admitting: Radiation Oncology

## 2018-09-08 ENCOUNTER — Other Ambulatory Visit: Payer: Self-pay

## 2018-09-08 ENCOUNTER — Ambulatory Visit
Admission: RE | Admit: 2018-09-08 | Discharge: 2018-09-08 | Disposition: A | Discharge status: Home / Self Care | Payer: Managed Care, Other (non HMO) | Source: Ambulatory Visit | Attending: Radiation Oncology | Admitting: Radiation Oncology

## 2018-09-08 VITALS — Ht 67.0 in | Wt 174.0 lb

## 2018-09-08 DIAGNOSIS — C50012 Malignant neoplasm of nipple and areola, left female breast: Secondary | ICD-10-CM

## 2018-09-08 DIAGNOSIS — D0512 Intraductal carcinoma in situ of left breast: Secondary | ICD-10-CM

## 2018-09-08 NOTE — Progress Notes (Signed)
[facility] CONSULT NOTE  Patient Care Team: Eston Esters as PCP - General  CHIEF COMPLAINTS/PURPOSE OF CONSULTATION:  Newly diagnosed breast cancer  HISTORY OF PRESENTING ILLNESS:  Rhonda Rocha 48 y.o. female is here because of recent diagnosis of DCIS and Paget's disease of the left breast. The patient noted left nipple pain and skin changes with the nipple crusting over and skin sloughing. Diagnostic mammogram on 05/01/18 showed no evidence of malignancy involving either breast. 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 Breast MRI on 09/03/18 showed asymmetrical enhancement of the left nipple and a 0.5cm mass in the lower inner quadrant of the left breast. She presents to the clinic today for initial evaluation.   I reviewed her records extensively and collaborated the history with the patient.  SUMMARY OF ONCOLOGIC HISTORY: Oncology History   No history exists.    MEDICAL HISTORY:  Past Medical History:  Diagnosis Date   Cancer [facility])     SURGICAL HISTORY: Past Surgical History:  Procedure Laterality Date   BREAST BIOPSY Left 08/21/2018   Procedure: LEFT BREAST BIOPSY;  Surgeon: Johnathan Hausen, MD;  Location: [facility];  Service: General;  Laterality: Left;   CESAREAN SECTION     Uterine Ablation      SOCIAL HISTORY: Social History   Socioeconomic History   Marital status: Single    Spouse name: Not on file   Number of children: Not on file   Years of education: Not on file   Highest education level: Not on file  Occupational History   Not on file  Social Needs   Financial resource strain: Not on file   Food insecurity    Worry: Not on file    Inability: Not on file   Transportation needs    Medical: No    Non-medical: No  Tobacco Use   Smoking status: Never Smoker   Smokeless tobacco: Never Used  Substance and Sexual  Activity   Alcohol use: No   Drug use: Never   Sexual activity: Not on file  Lifestyle   Physical activity    Days per week: Not on file    Minutes per session: Not on file   Stress: Not on file  Relationships   Social connections    Talks on phone: Not on file    Gets together: Not on file    Attends religious service: Not on file    Active member of club or organization: Not on file    Attends meetings of clubs or organizations: Not on file    Relationship status: Not on file   Intimate partner violence    Fear of current or ex partner: Not on file    Emotionally abused: Not on file    Physically abused: Not on file    Forced sexual activity: Not on file  Other Topics Concern   Not on file  Social History Narrative   Not on file    FAMILY HISTORY: Family History  Problem Relation Age of Onset   Healthy Mother     ALLERGIES:  has No Known Allergies.  MEDICATIONS:  Current Outpatient Medications  Medication Sig Dispense Refill   HYDROcodone-acetaminophen (NORCO/VICODIN) 5-325 MG tablet Take 1 tablet by mouth every 6 (six) hours as needed for moderate pain. (Patient not taking: Reported on 09/08/2018) 15 tablet 0   No current facility-administered medications for this visit.  REVIEW OF SYSTEMS:   Constitutional: Denies fevers, chills or abnormal night sweats Eyes: Denies blurriness of vision, double vision or watery eyes Ears, nose, mouth, throat, and face: Denies mucositis or sore throat Respiratory: Denies cough, dyspnea or wheezes Cardiovascular: Denies palpitation, chest discomfort or lower extremity swelling Gastrointestinal:  Denies nausea, heartburn or change in bowel habits Skin: Denies abnormal skin rashes Lymphatics: Denies new lymphadenopathy or easy bruising Neurological:Denies numbness, tingling or new weaknesses Behavioral/Psych: Mood is stable, no new changes  Breast: Left nipple discharge and skin changes All other systems were  reviewed with the patient and are negative.  PHYSICAL EXAMINATION: ECOG PERFORMANCE STATUS: 1 - Symptomatic but completely ambulatory  Vitals:   09/09/18 1556  BP: 118/76  Pulse: 73  Resp: 19  Temp: 98.7 F (37.1 C)  SpO2: 100%   Filed Weights   09/09/18 1556  Weight: 174 lb 4.8 oz (79.1 kg)    GENERAL:alert, no distress and comfortable SKIN: skin color, texture, turgor are normal, no rashes or significant lesions EYES: normal, conjunctiva are pink and non-injected, sclera clear OROPHARYNX:no exudate, no erythema and lips, buccal mucosa, and tongue normal  NECK: supple, thyroid normal size, non-tender, without nodularity LYMPH:  no palpable lymphadenopathy in the cervical, axillary or inguinal LUNGS: clear to auscultation and percussion with normal breathing effort HEART: regular rate & rhythm and no murmurs and no lower extremity edema ABDOMEN:abdomen soft, non-tender and normal bowel sounds Musculoskeletal:no cyanosis of digits and no clubbing  PSYCH: alert & oriented x 3 with fluent speech NEURO: no focal motor/sensory deficits    LABORATORY DATA:  I have reviewed the data as listed Lab Results  Component Value Date   WBC 6.4 11/09/2014   HGB 12.1 11/09/2014   HCT 35.2 (L) 11/09/2014   MCV 88.4 11/09/2014   PLT 227 11/09/2014   Lab Results  Component Value Date   NA 134 (L) 11/09/2014   K 3.4 (L) 11/09/2014   CL 101 11/09/2014   CO2 25 11/09/2014    RADIOGRAPHIC STUDIES: I have personally reviewed the radiological reports and agreed with the findings in the report.  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 It is considered a  precancerous lesion. DCIS is classified as a 0. It is generally detected through mammograms as calcifications. We discussed the significance of grades and its impact on prognosis. We also discussed the importance of ER and PR receptors and their implications to adjuvant treatment options. Prognosis of DCIS dependence on grade, comedo necrosis. It is anticipated that if not treated, 20-30% of DCIS can develop into invasive breast cancer.  Paget's disease counseling: I discussed the pathology in great detail with the patient and provided her with a copy of the report. Patient understands that Paget's disease is the description to the eczema-like eruption on the nipple and areola with a copious clear yellowish exudate. Quite commonly Paget's disease is associated with invasive or in situ carcinoma in nearly 85-88% of the time without any palpable mass or mammographic abnormality. Paget's disease tends to be ER/PR negative most commonly.   Recommendation: 1.  Resection of the nipple areolar complex 2. Followed by adjuvant radiation therapy 3. Followed by antiestrogen therapy with tamoxifen  5 years if she is ER PR positive  Return to clinic after surgery to discuss the final pathology report and come up with an adjuvant treatment plan.     All questions were answered. The patient knows to call the clinic with any problems, questions or concerns.   Rulon Eisenmenger, MD 09/09/2018    I, Molly Dorshimer, am acting as scribe for Nicholas Lose, MD.  I have reviewed the above documentation for accuracy and completeness, and I agree with the above.

## 2018-09-09 ENCOUNTER — Other Ambulatory Visit: Payer: Self-pay

## 2018-09-09 ENCOUNTER — Inpatient Hospital Stay: Payer: Managed Care, Other (non HMO) | Attending: Hematology and Oncology | Admitting: Hematology and Oncology

## 2018-09-09 VITALS — BP 118/76 | HR 73 | Temp 98.7°F | Resp 19 | Ht 67.0 in | Wt 174.3 lb

## 2018-09-09 DIAGNOSIS — C44591 Other specified malignant neoplasm of skin of breast: Secondary | ICD-10-CM | POA: Insufficient documentation

## 2018-09-09 DIAGNOSIS — D0512 Intraductal carcinoma in situ of left breast: Secondary | ICD-10-CM | POA: Diagnosis present

## 2018-09-09 NOTE — Assessment & Plan Note (Signed)
Left breast DCIS with Paget's disease diagnosed with a punch biopsy Breast MRI: Enhancement of the nipple, 5 mm nodule lower inner quadrant most likely benign lymph node needs ultrasound evaluation  DCIS counseling: I discussed with the patient the difference between DCIS and invasive breast cancer. It is considered a precancerous lesion. DCIS is classified as a 0. It is generally detected through mammograms as calcifications. We discussed the significance of grades and its impact on prognosis. We also discussed the importance of ER and PR receptors and their implications to adjuvant treatment options. Prognosis of DCIS dependence on grade, comedo necrosis. It is anticipated that if not treated, 20-30% of DCIS can develop into invasive breast cancer.  Paget's disease counseling: I discussed the pathology in great detail with the patient and provided her with a copy of the report. Patient understands that Paget's disease is the description to the eczema-like eruption on the nipple and areola with a copious clear yellowish exudate. Quite commonly Paget's disease is associated with invasive or in situ carcinoma in nearly 85-88% of the time without any palpable mass or mammographic abnormality. Paget's disease tends to be ER/PR negative most commonly.   Recommendation: 1.  Resection of the nipple areolar complex 2. Followed by adjuvant radiation therapy 3. Followed by antiestrogen therapy with tamoxifen 5 years if she is ER PR positive  Return to clinic after surgery to discuss the final pathology report and come up with an adjuvant treatment plan.

## 2018-09-10 ENCOUNTER — Encounter: Payer: Self-pay | Admitting: *Deleted

## 2018-09-10 ENCOUNTER — Other Ambulatory Visit: Payer: Self-pay | Admitting: Hematology and Oncology

## 2018-09-10 DIAGNOSIS — D0512 Intraductal carcinoma in situ of left breast: Secondary | ICD-10-CM

## 2018-09-10 NOTE — Progress Notes (Addendum)
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   HISTORY OF PRESENT ILLNESS::Rhonda Rocha is a 48 y.o. female who is seen for an initial consultation visit regarding the patient's diagnosis of Paget's disease/DCIS of the left breast.  Due to the ongoing coronavirus epidemic, the evaluation today was carried out through the video teleconference.  The patient was noted to have some suspicious changes within the left breast and underwent biopsy of the nipple region.  This revealed Paget's disease/DCIS of the left breast.  The patient subsequently has proceeded with an MRI scan of the breast bilaterally on 09/03/2018.  No suspicious findings on the right.  Left nipple enhancement was seen consistent with the previous biopsy and the associated findings.  A small area was also seen very superficial below the skin in the lower inner quadrant.  This was felt to possibly represent an intramammary lymph node although further evaluation with ultrasound was recommended.  She is going to further discuss this with surgery.  Tentatively, the patient is anticipating proceeding with a lumpectomy in the near future.  The patient is also being set up to see medical oncology as well.    PREVIOUS RADIATION THERAPY: No   PAST MEDICAL HISTORY:  has a past medical history of Cancer ([place]).     PAST SURGICAL HISTORY: Past Surgical History:  Procedure Laterality Date   BREAST BIOPSY Left 08/21/2018   Procedure: LEFT BREAST BIOPSY;  Surgeon: Johnathan Hausen, MD;  Location: [facility];  Service: General;  Laterality: Left;    CESAREAN SECTION     Uterine Ablation       FAMILY HISTORY: family history includes Healthy in her mother.   SOCIAL HISTORY:  reports that she has never smoked. She has never used smokeless tobacco. She reports that she does not drink alcohol or use drugs.   ALLERGIES: Patient has no known allergies.   MEDICATIONS:  Current Outpatient Medications  Medication Sig Dispense Refill   HYDROcodone-acetaminophen (NORCO/VICODIN) 5-325 MG tablet Take 1 tablet by mouth every 6 (six) hours as needed for moderate pain. (Patient not taking: Reported on 09/08/2018) 15 tablet 0   No current facility-administered medications for this encounter.      REVIEW OF SYSTEMS:  A 15 point review of systems is documented in the electronic medical record. This was obtained by the nursing staff. However, I reviewed this with the patient to discuss relevant findings and make appropriate changes.  Pertinent items are noted in HPI.    PHYSICAL EXAM:  height is 5\' 7"  (1.702 m) and weight is 174 lb (78.9 kg).   ECOG = 0  0 - Asymptomatic (Fully active, able to carry on all predisease activities without restriction)  1 - Symptomatic but completely ambulatory (Restricted in physically strenuous activity but ambulatory and able to carry out work of a light or sedentary nature. For example, light housework, office work)  2 - Symptomatic, <50% in bed during the day (Ambulatory and capable of all self care but unable to carry out any work activities. Up and about more than 50% of waking hours)  3 - Symptomatic, >  50% in bed, but not bedbound (Capable of only limited self-care, confined to bed or chair 50% or more of waking hours)  4 - Bedbound (Completely disabled. Cannot carry on any self-care. Totally confined to bed or chair)  5 - Death   Eustace Pen MM, Creech RH, Tormey DC, et al. [phone]). "Toxicity and response criteria of the [facility]". [place] Oncol. 5 (6): 649-55  alert, no acute  distress   LABORATORY DATA:  Lab Results  Component Value Date   WBC 6.4 11/09/2014   HGB 12.1 11/09/2014   HCT 35.2 (L) 11/09/2014   MCV 88.4 11/09/2014   PLT 227 11/09/2014   Lab Results  Component Value Date   NA 134 (L) 11/09/2014   K 3.4 (L) 11/09/2014   CL 101 11/09/2014   CO2 25 11/09/2014   Lab Results  Component Value Date   ALT 15 11/09/2014   AST 25 11/09/2014   ALKPHOS 34 (L) 11/09/2014   BILITOT 0.5 11/09/2014      RADIOGRAPHY: Mr Breast Bilateral W Wo Contrast Inc Cad  Addendum Date: 09/03/2018   ADDENDUM REPORT: 09/03/2018 14:18 ADDENDUM: I have now been informed that patient has had a LEFT nipple biopsy revealing Paget's disease of the epidermidis at the LEFT nipple (9 o'clock axis) and DCIS with associated Paget's disease of the epidermidis at the nipple (3 o'clock axis). The associated enhancement on today's MRI is confined to the nipple, including the inferomedial aspect of the nipple base, corresponding to the pathology and presumably accentuated by recent biopsy changes. RECOMMENDATION: As in the original report, targeted second-look ultrasound is recommended for the probably benign mass in the inner LEFT breast, just below the skin, measuring 5 mm. If a benign appearing lymph node is identified at this site, no additional workup is necessary. If no sonographic correlate is identified, as this mass is not amenable to MRI-guided biopsy due to its superficial location, would recommend MRI-guided clip placement for surgical excision. No evidence of additional multifocal or multicentric disease within the LEFT breast. No evidence of contralateral disease in the RIGHT breast BI-RADS CATEGORY: 3: Probably benign. Electronically Signed   By: Franki Cabot M.D.   On: 09/03/2018 14:18   Result Date: 09/03/2018 CLINICAL DATA:  48 year old with LEFT nipple pain and skin changes which patient describes as RIGHT all area under nipple with bleeding, nipple crusting and skin  sloughing. Diagnostic mammogram and ultrasound was performed on 05/01/2018 revealing no mammographic or sonographic evidence of malignancy within the LEFT breast. Physical exam of the LEFT nipple identified small focus over all skin under the nipple at the 8 o'clock location and expression of minimal clear discharged emanating from at least 3 duct orifices. LABS:  No applicable EXAM: BILATERAL BREAST MRI WITH AND WITHOUT CONTRAST TECHNIQUE: Multiplanar, multisequence MR images of both breasts were obtained prior to and following the intravenous administration of 8 ml of Gadavist Three-dimensional MR images were rendered by post-processing of the original MR data on an independent workstation. The three-dimensional MR images were interpreted, and findings are reported in the following complete MRI report for this study. Three dimensional images were evaluated at the independent DynaCad workstation COMPARISON:  Previous exam(s). FINDINGS: Breast composition: d. Extreme fibroglandular tissue. Background parenchymal enhancement: Mild Right breast: No suspicious enhancing mass, non-mass enhancement or secondary signs of malignancy. Left breast: Abnormal asymmetric enhancement of the LEFT nipple. No suspicious enhancing mass, non-mass enhancement or secondary signs of malignancy within the underlying subareolar LEFT breast.  Oval circumscribed enhancing mass within the lower inner quadrant of the RIGHT breast, just below the skin, measuring 5 mm, with mixed enhancement kinetics including suspicious washout (axial series 6, image 71), too small to definitively characterize but suspected to be an intramammary lymph node. Lymph nodes: No abnormal appearing lymph nodes. Ancillary findings:  None. IMPRESSION: 1. Abnormal asymmetric enhancement of the LEFT nipple. Recommend surgical consultation for possible punch biopsy. 2. Oval circumscribed enhancing mass within the lower inner quadrant of the LEFT breast, located just below  the skin, measuring 5 mm, suspected intramammary lymph node. No correlate seen on patient's recent diagnostic mammogram. Recommend targeted second-look ultrasound. If a benign appearing lymph node is identified in this area, no further workup is necessary. 3. No evidence of malignancy elsewhere within the LEFT breast. 4. No evidence of malignancy within the RIGHT breast. RECOMMENDATION: 1. Surgical consultation for possible punch biopsy at the LEFT nipple. 2. Targeted second-look ultrasound of the LEFT breast. If a benign appearing lymph node is identified within the lower inner quadrant of the LEFT breast, just below the skin, measuring approximately 5 mm, no additional workup is necessary. If no correlate is found by ultrasound, and clinical and/or punch biopsy results for the LEFT nipple are deemed benign, would then recommend follow-up breast MRI in 6 months to ensure stability. BI-RADS CATEGORY  4: Suspicious. Suspicious findings at the LEFT nipple. Probably benign mass within the inner LEFT breast. Electronically Signed: By: Franki Cabot M.D. On: 09/03/2018 12:12       IMPRESSION/PLAN:  The patient has a new diagnosis of Paget's disease/DCIS of the left breast.  Based on the MRI scan recently, it appears that additional imaging may be necessary prior to surgery given a very small area which may represent an intramammary lymph node within the left breast.  The anticipated plan is to proceed with a central lumpectomy.  The patient is also being set up to see medical oncology.  I discussed therefore the role of radiation treatment in this setting.  We discussed the anticipated improvement in local control.  We also discussed the logistics of treatment as well as the possible side effects and risks.  All of her questions were answered.  She is interested in proceeding with radiation treatment at the appropriate time.  I therefore look forward to seeing the patient postoperatively to review her case and  surgical findings at that time and to further coordinate and anticipated course of adjuvant radiation treatment.  Based on her current information, I would anticipate a course of whole breast radiation treatment for 6-1/2 weeks.   Due to the coronavirus pandemic, this encounter was provided by telemedicine platform MyChart.  The patient has given verbal consent for this type of encounter and has been advised to only accept a meeting of this type in a secure network environment. The time spent during this encounter was 40 minutes and more than 50% of that time was spent in the coordination of the patient's care. The attendants for this meeting include  Dr. Lisbeth Renshaw and Betha Loa.  During the encounter,  Dr. Lisbeth Renshaw, and was located at [facility] Radiation Oncology Department.  The patient was located at home.      ________________________________   Jodelle Gross, MD, PhD   **Disclaimer: This note was dictated with voice recognition software. Similar sounding words can inadvertently be transcribed and this note may contain transcription errors which may not have been corrected upon publication of note.**

## 2018-09-11 ENCOUNTER — Telehealth: Payer: Self-pay | Admitting: Genetic Counselor

## 2018-09-11 ENCOUNTER — Ambulatory Visit
Admission: RE | Admit: 2018-09-11 | Discharge: 2018-09-11 | Disposition: A | Discharge status: Home / Self Care | Payer: Managed Care, Other (non HMO) | Source: Ambulatory Visit | Attending: Hematology and Oncology | Admitting: Hematology and Oncology

## 2018-09-11 ENCOUNTER — Other Ambulatory Visit: Payer: Self-pay

## 2018-09-11 DIAGNOSIS — D0512 Intraductal carcinoma in situ of left breast: Secondary | ICD-10-CM

## 2018-09-11 NOTE — Telephone Encounter (Signed)
Received an urgent genetics referral from Dr. Hassell Done at [facility] for breast cancer. Ms. Rhonda Rocha has been cld and scheduled to see Roma Kayser on 8/26 at 2pm. Pt declined a webex visit. Aware to arrive 15 minutes early.

## 2018-09-14 ENCOUNTER — Telehealth: Payer: Self-pay | Admitting: *Deleted

## 2018-09-14 NOTE — Telephone Encounter (Signed)
Called pt and discussed prognostic panel. Informed pt that since it is ER negative, she will not have to take the anti-estrogen oral therapy after xrt. Pt denies further questions at this time. Physician team notified of prognostic panel

## 2018-09-15 ENCOUNTER — Other Ambulatory Visit: Payer: Self-pay | Admitting: Genetic Counselor

## 2018-09-15 ENCOUNTER — Telehealth: Payer: Self-pay | Admitting: Genetic Counselor

## 2018-09-15 DIAGNOSIS — D0512 Intraductal carcinoma in situ of left breast: Secondary | ICD-10-CM

## 2018-09-15 NOTE — Telephone Encounter (Signed)
Called patient regarding upcoming Webex appointment, patient would prefer this to be a walk-in visit. °

## 2018-09-16 ENCOUNTER — Other Ambulatory Visit: Payer: Self-pay | Admitting: Genetic Counselor

## 2018-09-16 ENCOUNTER — Inpatient Hospital Stay (HOSPITAL_BASED_OUTPATIENT_CLINIC_OR_DEPARTMENT_OTHER): Payer: Managed Care, Other (non HMO) | Admitting: Genetic Counselor

## 2018-09-16 ENCOUNTER — Inpatient Hospital Stay: Payer: Managed Care, Other (non HMO)

## 2018-09-16 DIAGNOSIS — D0512 Intraductal carcinoma in situ of left breast: Secondary | ICD-10-CM

## 2018-09-16 DIAGNOSIS — Z8042 Family history of malignant neoplasm of prostate: Secondary | ICD-10-CM

## 2018-09-16 DIAGNOSIS — Z800 Family history of malignant neoplasm of digestive organs: Secondary | ICD-10-CM | POA: Diagnosis not present

## 2018-09-16 DIAGNOSIS — Z803 Family history of malignant neoplasm of breast: Secondary | ICD-10-CM | POA: Diagnosis not present

## 2018-09-16 DIAGNOSIS — Z1379 Encounter for other screening for genetic and chromosomal anomalies: Secondary | ICD-10-CM

## 2018-09-17 ENCOUNTER — Encounter: Payer: Self-pay | Admitting: Genetic Counselor

## 2018-09-17 ENCOUNTER — Other Ambulatory Visit: Payer: Self-pay

## 2018-09-17 ENCOUNTER — Inpatient Hospital Stay: Payer: Managed Care, Other (non HMO)

## 2018-09-17 DIAGNOSIS — D0512 Intraductal carcinoma in situ of left breast: Secondary | ICD-10-CM

## 2018-09-17 DIAGNOSIS — Z800 Family history of malignant neoplasm of digestive organs: Secondary | ICD-10-CM | POA: Insufficient documentation

## 2018-09-17 DIAGNOSIS — Z803 Family history of malignant neoplasm of breast: Secondary | ICD-10-CM | POA: Insufficient documentation

## 2018-09-17 DIAGNOSIS — Z8042 Family history of malignant neoplasm of prostate: Secondary | ICD-10-CM | POA: Insufficient documentation

## 2018-09-17 NOTE — Progress Notes (Signed)
REFERRING PROVIDER: [facility] at [place],  [place] [address]  PRIMARY PROVIDER:  [place], [place] FOR VISIT:  1. Ductal carcinoma in situ (DCIS) of left breast   2. Family history of breast cancer   3. Family history of stomach cancer   4. Family history of prostate cancer      HISTORY OF PRESENT ILLNESS:   Rhonda Rocha, a 48 y.o. female, was seen for a [facility] cancer genetics consultation at the request of Dr. Jory Ee due to a personal and family history of cancer.  Rhonda Rocha presents to clinic today to discuss the possibility of a hereditary predisposition to cancer, genetic testing, and to further clarify her future cancer risks, as well as potential cancer risks for family members.   In August 2020, at the age of 37, Rhonda Rocha was diagnosed with DCIS of the left breast. The treatment plan includes lumpectomy and radiation.    CANCER HISTORY:  Oncology History   No history exists.     RISK FACTORS:  Menarche was at age 89.  First live birth at age 21.  Ovaries intact: yes.  Hysterectomy: no.  Menopausal status: premenopausal.  HRT use: 0 years. Colonoscopy: no; not examined. Mammogram within the last year: yes. Number of breast biopsies: 1. Up to date with pelvic exams: yes. Any excessive radiation exposure in the past: no  Past Medical History:  Diagnosis Date  . Cancer ([place])   . Family history of breast cancer   . Family history of prostate cancer   . Family history of stomach cancer     Past Surgical History:  Procedure Laterality Date  . BREAST BIOPSY Left 08/21/2018   Procedure: LEFT BREAST BIOPSY;  Surgeon: Johnathan Hausen, MD;  Location: [facility];  Service: General;  Laterality: Left;  . CESAREAN SECTION    . Uterine Ablation      Social History   Socioeconomic History  . Marital status: Single    Spouse name: Not on file  . Number of children:  Not on file  . Years of education: Not on file  . Highest education level: Not on file  Occupational History  . Not on file  Social Needs  . Financial resource strain: Not on file  . Food insecurity    Worry: Not on file    Inability: Not on file  . Transportation needs    Medical: No    Non-medical: No  Tobacco Use  . Smoking status: Never Smoker  . Smokeless tobacco: Never Used  Substance and Sexual Activity  . Alcohol use: No  . Drug use: Never  . Sexual activity: Not on file  Lifestyle  . Physical activity    Days per week: Not on file    Minutes per session: Not on file  . Stress: Not on file  Relationships  . Social Herbalist on phone: Not on file    Gets together: Not on file    Attends religious service: Not on file    Active member of club or organization: Not on file    Attends meetings of clubs or organizations: Not on file    Relationship status: Not on file  Other Topics Concern  . Not on file  Social History Narrative  . Not on file     FAMILY HISTORY:  We obtained a detailed, 4-generation family history.  Significant diagnoses are listed below: Family History  Problem Relation Age of Onset  . Healthy Mother   . Other Father 75       "murdered"  . Breast cancer Maternal Aunt   . Prostate cancer Maternal Uncle   . Breast cancer Paternal Aunt 93  . Breast cancer Maternal Aunt 49  . Prostate cancer Maternal Uncle 66       d. 106  . Stomach cancer Cousin 66   The patient has three daughters who are all cancer free.  She has two brothers who are cancer free.  Her father is deceased and her mother is living.  The patient's mother has three brothers and six sisters.  Two sisters had breast cancer, one at age 63.  One sister has a daughter who has stomach cancer at 65.  Two brothers had prostate cancer. Both maternal grandparents are deceased from non-cancer related issues.  The patient's father was murdered at 64.  He had three brothers and  three sisters.  One sister had breast cancer at 52.  The paternal grandparents are deceased from non-cancer panels.  Rhonda Rocha is unaware of previous family history of genetic testing for hereditary cancer risks. Patient's maternal ancestors are of African American descent, and paternal ancestors are of African American descent. There is no reported Ashkenazi Jewish ancestry. There is no known consanguinity.   GENETIC COUNSELING ASSESSMENT: [name] is a 48 y.o. female with a personal and family history of breast cancer which is somewhat suggestive of a hereditary breast cancer syndrome and predisposition to cancer given her young age of onset. We, therefore, discussed and recommended the following at today's visit.   DISCUSSION: We discussed that 5 - 10% of breast cancer is hereditary, with most cases associated with BRCA.  There are other genes that can be associated with hereditary breast cancer syndromes.  These include ATM, CHEK2 and PALB2.  We discussed that testing is beneficial for several reasons including knowing how to follow individuals after completing their treatment, identifying whether potential treatment options such as PARP inhibitors would be beneficial, and understand if other family members could be at risk for cancer and allow them to undergo genetic testing.   We reviewed the characteristics, features and inheritance patterns of hereditary cancer syndromes. We also discussed genetic testing, including the appropriate family members to test, the process of testing, insurance coverage and turn-around-time for results. We discussed the implications of a negative, positive and/or variant of uncertain significant result. In order to get genetic test results in a timely manner so that [name] can use these genetic test results for surgical decisions, we recommended [name] pursue genetic testing for the STAT panel. Once complete, we recommend [name] pursue reflex genetic testing to  the multi cancer gene panel. The Multi-Gene Panel offered by [facility] includes sequencing and/or deletion duplication testing of the following 85 genes: AIP, ALK, APC, ATM, AXIN2,BAP1,  BARD1, BLM, BMPR1A, BRCA1, BRCA2, BRIP1, CASR, CDC73, CDH1, CDK4, CDKN1B, CDKN1C, CDKN2A (p14ARF), CDKN2A (p16INK4a), CEBPA, CHEK2, CTNNA1, DICER1, DIS3L2, EGFR (c.2369C>T, p.Thr790Met variant only), EPCAM (Deletion/duplication testing only), FH, FLCN, GATA2, GPC3, GREM1 (Promoter region deletion/duplication testing only), HOXB13 (c.251G>A, p.Gly84Glu), HRAS, KIT, MAX, MEN1, MET, MITF (c.952G>A, p.Glu318Lys variant only), MLH1, MSH2, MSH3, MSH6, MUTYH, NBN, NF1, NF2, NTHL1, PALB2, PDGFRA, PHOX2B, PMS2, POLD1, POLE, POT1, PRKAR1A, PTCH1, PTEN, RAD50, RAD51C, RAD51D, RB1, RECQL4, RET, RNF43, RUNX1, SDHAF2, SDHA (sequence changes only), SDHB, SDHC, SDHD, SMAD4, SMARCA4, SMARCB1, SMARCE1, STK11, SUFU, TERC, TERT, TMEM127, TP53, TSC1, TSC2, VHL, WRN and WT1.  Based on Ms. Varkey's personal and family history of cancer, she meets medical criteria for genetic testing. Despite that she meets criteria, she may still have an out of pocket cost.   PLAN: After considering the risks, benefits, and limitations, Ms. Massoud provided informed consent to pursue genetic testing and the blood sample was sent to [facility] for analysis of the Multi-cancer. Results should be available within approximately 5-10 days time, with an additional 2-3 weeks' time, at which point they will be disclosed by telephone to Ms. Falwell, as will any additional recommendations warranted by these results. Ms. Carrillo will receive a summary of her genetic counseling visit and a copy of her results once available. This information will also be available in Epic.   Lastly, we encouraged Ms. Feldkamp to remain in contact with cancer genetics annually so that we can continuously update the family history and inform her of any changes in cancer genetics and testing that  may be of benefit for this family.   Ms. Densmore questions were answered to her satisfaction today. Our contact information was provided should additional questions or concerns arise. Thank you for the referral and allowing [place] to share in the care of your patient.    P. Florene Glen, [place], [facility] Licensed, Insurance risk surveyor Santiago [email or web address] phone: [phone]  The patient was seen for a total of 50 minutes in face-to-face genetic counseling.  This patient was discussed with Drs. Magrinat, Lindi Adie and/or Burr Medico who agrees with the above.    _______________________________________________________________________ For Office Staff:  Number of people involved in session: 1 Was an Intern/ student involved with case: no

## 2018-09-18 ENCOUNTER — Encounter: Payer: Self-pay | Admitting: *Deleted

## 2018-09-25 ENCOUNTER — Encounter (HOSPITAL_BASED_OUTPATIENT_CLINIC_OR_DEPARTMENT_OTHER): Payer: Self-pay

## 2018-09-25 ENCOUNTER — Encounter: Payer: Self-pay | Admitting: Genetic Counselor

## 2018-09-25 ENCOUNTER — Telehealth: Payer: Self-pay | Admitting: Hematology and Oncology

## 2018-09-25 ENCOUNTER — Telehealth: Payer: Self-pay | Admitting: Genetic Counselor

## 2018-09-25 ENCOUNTER — Encounter: Payer: Self-pay | Admitting: *Deleted

## 2018-09-25 ENCOUNTER — Other Ambulatory Visit: Payer: Self-pay

## 2018-09-25 ENCOUNTER — Ambulatory Visit: Payer: Self-pay | Admitting: Genetic Counselor

## 2018-09-25 DIAGNOSIS — Z1379 Encounter for other screening for genetic and chromosomal anomalies: Secondary | ICD-10-CM

## 2018-09-25 NOTE — Progress Notes (Addendum)
HPI:  Ms. Tranchina was previously seen in the [facility] due to a personal and family history of cancer and concerns regarding a hereditary predisposition to cancer. Please refer to our prior cancer genetics clinic note for more information regarding our discussion, assessment and recommendations, at the time. Ms. Neises recent genetic test results were disclosed to her, as were recommendations warranted by these results. These results and recommendations are discussed in more detail below.  CANCER HISTORY:  Oncology History  Ductal carcinoma in situ (DCIS) of left breast  09/09/2018 Initial Diagnosis   Ductal carcinoma in situ (DCIS) of left breast   09/24/2018 Genetic Testing   MET c.2609T>A and MSH2 c.508C>G VUS identified on the multi-cancer panel.  The Multi-Gene Panel offered by [facility] includes sequencing and/or deletion duplication testing of the following 85 genes: AIP, ALK, APC, ATM, AXIN2,BAP1,  BARD1, BLM, BMPR1A, BRCA1, BRCA2, BRIP1, CASR, CDC73, CDH1, CDK4, CDKN1B, CDKN1C, CDKN2A (p14ARF), CDKN2A (p16INK4a), CEBPA, CHEK2, CTNNA1, DICER1, DIS3L2, EGFR (c.2369C>T, p.Thr790Met variant only), EPCAM (Deletion/duplication testing only), FH, FLCN, GATA2, GPC3, GREM1 (Promoter region deletion/duplication testing only), HOXB13 (c.251G>A, p.Gly84Glu), HRAS, KIT, MAX, MEN1, MET, MITF (c.952G>A, p.Glu318Lys variant only), MLH1, MSH2, MSH3, MSH6, MUTYH, NBN, NF1, NF2, NTHL1, PALB2, PDGFRA, PHOX2B, PMS2, POLD1, POLE, POT1, PRKAR1A, PTCH1, PTEN, RAD50, RAD51C, RAD51D, RB1, RECQL4, RET, RNF43, RUNX1, SDHAF2, SDHA (sequence changes only), SDHB, SDHC, SDHD, SMAD4, SMARCA4, SMARCB1, SMARCE1, STK11, SUFU, TERC, TERT, TMEM127, TP53, TSC1, TSC2, VHL, WRN and WT1.  The report date is September 24, 2018.     FAMILY HISTORY:  We obtained a detailed, 4-generation family history.  Significant diagnoses are listed below: Family History  Problem Relation Age of Onset  . Healthy Mother   . Other  Father 59       "murdered"  . Breast cancer Maternal Aunt   . Prostate cancer Maternal Uncle   . Breast cancer Paternal Aunt 53  . Breast cancer Maternal Aunt 56  . Prostate cancer Maternal Uncle 66       d. 6  . Stomach cancer Cousin 37    The patient has three daughters who are all cancer free.  She has two brothers who are cancer free.  Her father is deceased and her mother is living.  The patient's mother has three brothers and six sisters.  Two sisters had breast cancer, one at age 22.  One sister has a daughter who has stomach cancer at 73.  Two brothers had prostate cancer. Both maternal grandparents are deceased from non-cancer related issues.  The patient's father was murdered at 32.  He had three brothers and three sisters.  One sister had breast cancer at 15.  The paternal grandparents are deceased from non-cancer panels.  Ms. Hollman is unaware of previous family history of genetic testing for hereditary cancer risks. Patient's maternal ancestors are of African American descent, and paternal ancestors are of African American descent. There is no reported Ashkenazi Jewish ancestry. There is no known consanguinity.    GENETIC TEST RESULTS: Genetic testing reported out on September 30, 2018 through the multi cancer cancer panel found no pathogenic mutations. The Multi-Gene Panel offered by [facility] includes sequencing and/or deletion duplication testing of the following 85 genes: AIP, ALK, APC, ATM, AXIN2,BAP1,  BARD1, BLM, BMPR1A, BRCA1, BRCA2, BRIP1, CASR, CDC73, CDH1, CDK4, CDKN1B, CDKN1C, CDKN2A (p14ARF), CDKN2A (p16INK4a), CEBPA, CHEK2, CTNNA1, DICER1, DIS3L2, EGFR (c.2369C>T, p.Thr790Met variant only), EPCAM (Deletion/duplication testing only), FH, FLCN, GATA2, GPC3, GREM1 (Promoter region deletion/duplication testing only),  HOXB13 (c.251G>A, p.Gly84Glu), HRAS, KIT, MAX, MEN1, MET, MITF (c.952G>A, p.Glu318Lys variant only), MLH1, MSH2, MSH3, MSH6, MUTYH, NBN, NF1, NF2, NTHL1,  PALB2, PDGFRA, PHOX2B, PMS2, POLD1, POLE, POT1, PRKAR1A, PTCH1, PTEN, RAD50, RAD51C, RAD51D, RB1, RECQL4, RET, RNF43, RUNX1, SDHAF2, SDHA (sequence changes only), SDHB, SDHC, SDHD, SMAD4, SMARCA4, SMARCB1, SMARCE1, STK11, SUFU, TERC, TERT, TMEM127, TP53, TSC1, TSC2, VHL, WRN and WT1.  The test report has been scanned into EPIC and is located under the Molecular Pathology section of the Results Review tab.  A portion of the result report is included below for reference.     We discussed with Ms. Propst that because current genetic testing is not perfect, it is possible there may be a gene mutation in one of these genes that current testing cannot detect, but that chance is small.  We also discussed, that there could be another gene that has not yet been discovered, or that we have not yet tested, that is responsible for the cancer diagnoses in the family. It is also possible there is a hereditary cause for the cancer in the family that Ms. Rueger did not inherit and therefore was not identified in her testing.  Therefore, it is important to remain in touch with cancer genetics in the future so that we can continue to offer Ms. Certain the most up to date genetic testing.   Genetic testing did identify two Variants of uncertain significance (VUS) - one in the MET gene called c.2609A>T, and a second in the MSH2 gene called c.508C>G..  At this time, it is unknown if these variants are associated with increased cancer risk or if they are normal findings, but most variants such as these get reclassified to being inconsequential. They should not be used to make medical management decisions. With time, we suspect the lab will determine the significance of these variants, if any. If we do learn more about them, we will try to contact Ms. Stayer to discuss it further. However, it is important to stay in touch with [place] periodically and keep the address and phone number up to date.  UPDATED: MET c.2609T>A and MSH2 c.508C>G  VUS's have been reclassified as Likely benign and Benign, respectively.  The updated report date is March 11, 2020.   ADDITIONAL GENETIC TESTING: We discussed with Ms. Christoffersen that her genetic testing was fairly extensive.  If there are genes identified to increase cancer risk that can be analyzed in the future, we would be happy to discuss and coordinate this testing at that time.    CANCER SCREENING RECOMMENDATIONS: Ms. Pinkney test result is considered negative (normal).  This means that we have not identified a hereditary cause for her personal and family history of cancer at this time. Most cancers happen by chance and this negative test suggests that her cancer may fall into this category.    While reassuring, this does not definitively rule out a hereditary predisposition to cancer. It is still possible that there could be genetic mutations that are undetectable by current technology. There could be genetic mutations in genes that have not been tested or identified to increase cancer risk.  Therefore, it is recommended she continue to follow the cancer management and screening guidelines provided by her oncology and primary healthcare provider.   An individual's cancer risk and medical management are not determined by genetic test results alone. Overall cancer risk assessment incorporates additional factors, including personal medical history, family history, and any available genetic information that may result in a  personalized plan for cancer prevention and surveillance  An individual's cancer risk and medical management are not determined by genetic test results alone. Overall cancer risk assessment incorporates additional factors, including personal medical history, family history, and any available genetic information that may result in a personalized plan for cancer prevention and surveillance.  RECOMMENDATIONS FOR FAMILY MEMBERS:  Individuals in this family might be at some increased  risk of developing cancer, over the general population risk, simply due to the family history of cancer.  We recommended women in this family have a yearly mammogram beginning at age 71, or 62 years younger than the earliest onset of cancer, an annual clinical breast exam, and perform monthly breast self-exams. Women in this family should also have a gynecological exam as recommended by their primary provider. All family members should have a colonoscopy by age 10.  FOLLOW-UP: Lastly, we discussed with Ms. Goertzen that cancer genetics is a rapidly advancing field and it is possible that new genetic tests will be appropriate for her and/or her family members in the future. We encouraged her to remain in contact with cancer genetics on an annual basis so we can update her personal and family histories and let her know of advances in cancer genetics that may benefit this family.   Our contact number was provided. Ms. Caley questions were answered to her satisfaction, and she knows she is welcome to call us at anytime with additional questions or concerns.   Roma Kayser, [place], [facility] Licensed, Certified Genetic Counselor [email or web address]

## 2018-09-25 NOTE — Telephone Encounter (Signed)
Scheduled appt per 9/03 sch message - pt aware of appt date and time   

## 2018-09-25 NOTE — Telephone Encounter (Signed)
Revealed negative genetic testing.  Discussed that we do not know why she has breast cancer or why there is cancer in the family. It could be due to a different gene that we are not testing, or maybe our current technology may not be able to pick something up.  It will be important for her to keep in contact with genetics to keep up with whether additional testing may be needed.  Two VUS were identified.  They will not change medical management.

## 2018-09-29 ENCOUNTER — Ambulatory Visit: Payer: Self-pay | Admitting: Surgery

## 2018-09-30 NOTE — Progress Notes (Signed)
Called and left message for patient to call back. Patient needs to pick up ensure drink pre surgery

## 2018-10-02 ENCOUNTER — Other Ambulatory Visit (HOSPITAL_COMMUNITY)
Admission: RE | Admit: 2018-10-02 | Discharge: 2018-10-02 | Disposition: A | Discharge status: Home / Self Care | Payer: Managed Care, Other (non HMO) | Source: Ambulatory Visit | Attending: Surgery | Admitting: Surgery

## 2018-10-02 DIAGNOSIS — Z01812 Encounter for preprocedural laboratory examination: Secondary | ICD-10-CM | POA: Diagnosis not present

## 2018-10-02 DIAGNOSIS — U071 COVID-19: Secondary | ICD-10-CM | POA: Diagnosis not present

## 2018-10-03 ENCOUNTER — Encounter: Payer: Self-pay | Admitting: Radiation Oncology

## 2018-10-03 NOTE — Progress Notes (Signed)
Dr. Kieth Brightly (on-call MD for [facility]) notified of COVID test results.

## 2018-10-03 NOTE — Progress Notes (Signed)
Telephone call: -discussed with patient about positive results for covid test. She notes she had cough, lung pain, loss of smell 2 weeks ago and quarantined at that time but now feels fine. I recommended reaching out to PCP. I will forward the results along to Dr. Hassell Done

## 2018-10-05 LAB — NOVEL CORONAVIRUS, NAA (HOSP ORDER, SEND-OUT TO REF LAB; TAT 18-24 HRS): SARS-CoV-2, NAA: DETECTED — AB

## 2018-10-09 ENCOUNTER — Encounter (HOSPITAL_BASED_OUTPATIENT_CLINIC_OR_DEPARTMENT_OTHER): Payer: Self-pay | Admitting: *Deleted

## 2018-10-09 ENCOUNTER — Telehealth: Payer: Self-pay | Admitting: Hematology and Oncology

## 2018-10-09 ENCOUNTER — Other Ambulatory Visit: Payer: Self-pay

## 2018-10-09 NOTE — Telephone Encounter (Signed)
Scheduled apt per 9/17 sch message - pt is aware of new appt date and time

## 2018-10-13 ENCOUNTER — Other Ambulatory Visit (HOSPITAL_COMMUNITY): Admission: RE | Admit: 2018-10-13 | Payer: Managed Care, Other (non HMO) | Source: Ambulatory Visit

## 2018-10-14 NOTE — Progress Notes (Signed)
      Enhanced Recovery after Surgery for Orthopedics Enhanced Recovery after Surgery is a protocol used to improve the stress on your body and your recovery after surgery.  Patient Instructions  . The night before surgery:  o No food after midnight. ONLY clear liquids after midnight  . The day of surgery (if you do NOT have diabetes):  o Drink ONE (1) Pre-Surgery Clear Ensure as directed.   o This drink was given to you during your hospital  pre-op appointment visit. o The pre-op nurse will instruct you on the time to drink the  Pre-Surgery Ensure depending on your surgery time. o Finish the drink at the designated time by the pre-op nurse.  o Nothing else to drink after completing the  Pre-Surgery Clear Ensure.  . The day of surgery (if you have diabetes): o Drink ONE (1) Gatorade 2 (G2) as directed. o This drink was given to you during your hospital  pre-op appointment visit.  o The pre-op nurse will instruct you on the time to drink the   Gatorade 2 (G2) depending on your surgery time. o Color of the Gatorade may vary. Red is not allowed. o Nothing else to drink after completing the  Gatorade 2 (G2).         If you have questions, please contact your surgeon's office.  Hibiclens given to pt with instructions. 

## 2018-10-15 NOTE — Anesthesia Preprocedure Evaluation (Addendum)
Anesthesia Evaluation  Patient identified by MRN, date of birth, ID band Patient awake    Reviewed: Allergy & Precautions, NPO status , Patient's Chart, lab work & pertinent test results  History of Anesthesia Complications Negative for: history of anesthetic complications  Airway Mallampati: III  TM Distance: >3 FB Neck ROM: Full    Dental  (+) Teeth Intact, Dental Advisory Given   Pulmonary neg pulmonary ROS,  10/02/2018 SARS coronavirus POSITIVE Pt has been Asymptomatic for more than 2 weeks   breath sounds clear to auscultation       Cardiovascular negative cardio ROS   Rhythm:Regular Rate:Normal     Neuro/Psych negative neurological ROS     GI/Hepatic negative GI ROS, Neg liver ROS,   Endo/Other  negative endocrine ROS  Renal/GU negative Renal ROS     Musculoskeletal   Abdominal   Peds  Hematology negative hematology ROS (+)   Anesthesia Other Findings   Reproductive/Obstetrics                            Anesthesia Physical Anesthesia Plan  ASA: I  Anesthesia Plan: General   Post-op Pain Management:    Induction: Intravenous  PONV Risk Score and Plan: 3 and Ondansetron, Dexamethasone and Scopolamine patch - Pre-op  Airway Management Planned: LMA  Additional Equipment:   Intra-op Plan:   Post-operative Plan:   Informed Consent: I have reviewed the patients History and Physical, chart, labs and discussed the procedure including the risks, benefits and alternatives for the proposed anesthesia with the patient or authorized representative who has indicated his/her understanding and acceptance.     Dental advisory given  Plan Discussed with: CRNA and Surgeon  Anesthesia Plan Comments:        Anesthesia Quick Evaluation

## 2018-10-15 NOTE — H&P (Signed)
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 female who had scabbing of the left nipple and on biopsy this is Paget's with underlying DCIS.  MRI did not show any other suspicious lesions.  For resection of nipple areolar complex on the left (partial mastectomy)  Past Medical History:  Diagnosis Date  . Cancer ([place])   . Family history of breast cancer   . Family history of prostate cancer   . Family history of stomach cancer     Past Surgical History:  Procedure Laterality Date  . BREAST BIOPSY Left 08/21/2018   Procedure: LEFT BREAST BIOPSY;  Surgeon: Johnathan Hausen, MD;  Location: [facility];  Service: General;  Laterality: Left;  . CESAREAN SECTION    . Uterine Ablation      No current facility-administered medications for this encounter.    Current Outpatient Medications  Medication Sig Dispense Refill  . acetaminophen (TYLENOL) 325 MG tablet Take 650 mg by mouth every 6 (six) hours as needed.    Marland Kitchen ibuprofen (ADVIL) 400 MG tablet Take 400 mg by mouth every 6 (six) hours as needed.     Patient has no known allergies. Family History  Problem Relation Age of Onset  . Healthy Mother   . Other Father 75       "murdered"  . Breast cancer Maternal Aunt   . Prostate cancer Maternal Uncle   . Breast cancer Paternal Aunt 5  . Breast cancer Maternal Aunt 64  . Prostate cancer Maternal Uncle 66       d. 27  . Stomach cancer Cousin 21   Social History:   reports that she has never smoked. She has never used smokeless tobacco. She reports that she does not drink alcohol or use drugs.   REVIEW OF SYSTEMS : Negative except for see problem list  Physical Exam:   Height 5\' 7"  (1.702 m), weight 78.9 kg, last menstrual period 09/22/2018. Body mass index is 27.24 kg/m.  Gen:  WDWN AAF NAD  Neurological: Alert and oriented to person, place, and time. Motor and sensory function is  grossly intact  Head: Normocephalic and atraumatic.  Eyes: Conjunctivae are normal. Pupils are equal, round, and reactive to light. No scleral icterus.  Neck: Normal range of motion. Neck supple. No tracheal deviation or thyromegaly present.  Cardiovascular:  SR without murmurs or gallops.  No carotid bruits Breast:  Left breast with healed biopsy site on the nipple Respiratory: Effort normal.  No respiratory distress. No chest wall tenderness. Breath sounds normal.  No wheezes, rales or rhonchi.  Abdomen:  nontender  GU:  Not examined Musculoskeletal: Normal range of motion. Extremities are nontender. No cyanosis, edema or clubbing noted Lymphadenopathy: No cervical, preauricular, postauricular or axillary adenopathy is present Skin: Skin is warm and dry. No rash noted. No diaphoresis. No erythema. No pallor. Pscyh: Normal mood and affect. Behavior is normal. Judgment and thought content normal.   LABORATORY RESULTS: No results found for this or any previous visit (from the past 48 hour(s)).   RADIOLOGY RESULTS: No results found.  Problem List: Patient Active Problem List   Diagnosis Date Noted  . Genetic testing 09/25/2018  . Family history of breast cancer   . Family history of stomach cancer   . Family history of prostate cancer   . 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 Numbness and  tingling 01/01/2013  . IRREGULAR MENSES 01/01/2010  . FOOT PAIN 03/24/2009  . PES PLANUS 03/16/2008  . HAIR LOSS 02/11/2008  . ACUTE CYSTITIS 01/04/2008  . DEPRESSION 10/22/2007  . MICROSCOPIC HEMATURIA 10/22/2007  . BREAST TENDERNESS 10/22/2007  . PERIPHERAL EDEMA 05/19/2007  . CANDIDIASIS, VAGINAL 03/02/2007  . VAGINAL DISCHARGE 03/02/2007  . 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 Hassell Done, MD, [facility] [phone]  beeper [phone]  10/15/2018 12:43 PM

## 2018-10-16 ENCOUNTER — Ambulatory Visit (HOSPITAL_BASED_OUTPATIENT_CLINIC_OR_DEPARTMENT_OTHER)
Admission: RE | Admit: 2018-10-16 | Discharge: 2018-10-16 | Disposition: A | Discharge status: Home / Self Care | Payer: Managed Care, Other (non HMO) | Attending: Surgery | Admitting: Surgery

## 2018-10-16 ENCOUNTER — Other Ambulatory Visit: Payer: Self-pay

## 2018-10-16 ENCOUNTER — Encounter (HOSPITAL_BASED_OUTPATIENT_CLINIC_OR_DEPARTMENT_OTHER): Payer: Self-pay | Admitting: Anesthesiology

## 2018-10-16 ENCOUNTER — Encounter (HOSPITAL_BASED_OUTPATIENT_CLINIC_OR_DEPARTMENT_OTHER)
Admission: RE | Disposition: A | Discharge status: Home / Self Care | Payer: Self-pay | Source: Home / Self Care | Attending: Surgery

## 2018-10-16 ENCOUNTER — Ambulatory Visit (HOSPITAL_BASED_OUTPATIENT_CLINIC_OR_DEPARTMENT_OTHER): Payer: Managed Care, Other (non HMO) | Admitting: Certified Registered"

## 2018-10-16 DIAGNOSIS — C44591 Other specified malignant neoplasm of skin of breast: Secondary | ICD-10-CM | POA: Diagnosis not present

## 2018-10-16 DIAGNOSIS — D0512 Intraductal carcinoma in situ of left breast: Secondary | ICD-10-CM | POA: Diagnosis not present

## 2018-10-16 DIAGNOSIS — Z803 Family history of malignant neoplasm of breast: Secondary | ICD-10-CM | POA: Insufficient documentation

## 2018-10-16 HISTORY — PX: MASTECTOMY, PARTIAL: SHX709

## 2018-10-16 SURGERY — MASTECTOMY PARTIAL
Anesthesia: General | Site: Breast | Laterality: Left

## 2018-10-16 MED ORDER — DEXAMETHASONE SODIUM PHOSPHATE 10 MG/ML IJ SOLN
INTRAMUSCULAR | Status: DC | PRN
  Administered 2018-10-16: 10 mg via INTRAVENOUS

## 2018-10-16 MED ORDER — MEPERIDINE HCL 25 MG/ML IJ SOLN: 6.2500 mg | INTRAMUSCULAR | Status: DC | PRN

## 2018-10-16 MED ORDER — ACETAMINOPHEN 500 MG PO TABS: 1000.0000 mg | ORAL_TABLET | Freq: Four times a day (QID) | ORAL | Status: DC

## 2018-10-16 MED ORDER — SCOPOLAMINE 1 MG/3DAYS TD PT72
1.0000 | MEDICATED_PATCH | Freq: Once | TRANSDERMAL | Status: DC
  Administered 2018-10-16: 07:00:00 1.5 mg via TRANSDERMAL

## 2018-10-16 MED ORDER — LIDOCAINE HCL (CARDIAC) PF 100 MG/5ML IV SOSY
PREFILLED_SYRINGE | INTRAVENOUS | Status: DC | PRN
  Administered 2018-10-16: 40 mg via INTRATRACHEAL

## 2018-10-16 MED ORDER — ACETAMINOPHEN 500 MG PO TABS
1000.0000 mg | ORAL_TABLET | ORAL | Status: AC
  Administered 2018-10-16: 1000 mg via ORAL

## 2018-10-16 MED ORDER — HYDROMORPHONE HCL 1 MG/ML IJ SOLN
0.2500 mg | INTRAMUSCULAR | Status: DC | PRN
  Administered 2018-10-16: 10:00:00 0.5 mg via INTRAVENOUS
  Administered 2018-10-16 (×2): 0.25 mg via INTRAVENOUS

## 2018-10-16 MED ORDER — CEFAZOLIN SODIUM-DEXTROSE 2-4 GM/100ML-% IV SOLN
2.0000 g | INTRAVENOUS | Status: AC
  Administered 2018-10-16: 2 g via INTRAVENOUS

## 2018-10-16 MED ORDER — MIDAZOLAM HCL 5 MG/5ML IJ SOLN
INTRAMUSCULAR | Status: DC | PRN
  Administered 2018-10-16: 2 mg via INTRAVENOUS

## 2018-10-16 MED ORDER — CHLORHEXIDINE GLUCONATE CLOTH 2 % EX PADS: 6.0000 | MEDICATED_PAD | Freq: Once | CUTANEOUS | Status: DC

## 2018-10-16 MED ORDER — PROMETHAZINE HCL 25 MG/ML IJ SOLN: 6.2500 mg | INTRAMUSCULAR | Status: DC | PRN

## 2018-10-16 MED ORDER — HYDROMORPHONE HCL 1 MG/ML IJ SOLN
INTRAMUSCULAR | Status: AC
  Filled 2018-10-16: qty 0.5

## 2018-10-16 MED ORDER — ACETAMINOPHEN 500 MG PO TABS
ORAL_TABLET | ORAL | Status: AC
  Filled 2018-10-16: qty 2

## 2018-10-16 MED ORDER — OXYCODONE HCL 5 MG PO TABS: 5.0000 mg | ORAL_TABLET | ORAL | Status: DC | PRN

## 2018-10-16 MED ORDER — ONDANSETRON HCL 4 MG/2ML IJ SOLN
INTRAMUSCULAR | Status: DC | PRN
  Administered 2018-10-16: 4 mg via INTRAVENOUS

## 2018-10-16 MED ORDER — FENTANYL CITRATE (PF) 100 MCG/2ML IJ SOLN
50.0000 ug | INTRAMUSCULAR | Status: DC | PRN
  Administered 2018-10-16: 100 ug via INTRAVENOUS

## 2018-10-16 MED ORDER — MORPHINE SULFATE (PF) 4 MG/ML IV SOLN: 1.0000 mg | INTRAVENOUS | Status: DC | PRN

## 2018-10-16 MED ORDER — FENTANYL CITRATE (PF) 100 MCG/2ML IJ SOLN
INTRAMUSCULAR | Status: AC
  Filled 2018-10-16: qty 2

## 2018-10-16 MED ORDER — MIDAZOLAM HCL 2 MG/2ML IJ SOLN
INTRAMUSCULAR | Status: AC
  Filled 2018-10-16: qty 2

## 2018-10-16 MED ORDER — CEFAZOLIN SODIUM-DEXTROSE 2-4 GM/100ML-% IV SOLN
INTRAVENOUS | Status: AC
  Filled 2018-10-16: qty 100

## 2018-10-16 MED ORDER — MIDAZOLAM HCL 2 MG/2ML IJ SOLN: 1.0000 mg | INTRAMUSCULAR | Status: DC | PRN

## 2018-10-16 MED ORDER — SCOPOLAMINE 1 MG/3DAYS TD PT72: 1.0000 | MEDICATED_PATCH | Freq: Once | TRANSDERMAL | Status: DC

## 2018-10-16 MED ORDER — ONDANSETRON 4 MG PO TBDP: 4.0000 mg | ORAL_TABLET | Freq: Four times a day (QID) | ORAL | Status: DC | PRN

## 2018-10-16 MED ORDER — GABAPENTIN 300 MG PO CAPS: 300.0000 mg | ORAL_CAPSULE | Freq: Two times a day (BID) | ORAL | Status: DC

## 2018-10-16 MED ORDER — PROPOFOL 10 MG/ML IV BOLUS
INTRAVENOUS | Status: DC | PRN
  Administered 2018-10-16: 200 mg via INTRAVENOUS

## 2018-10-16 MED ORDER — ONDANSETRON HCL 4 MG/2ML IJ SOLN: 4.0000 mg | Freq: Four times a day (QID) | INTRAMUSCULAR | Status: DC | PRN

## 2018-10-16 MED ORDER — BUPIVACAINE LIPOSOME 1.3 % IJ SUSP: 20.0000 mL | Freq: Once | INTRAMUSCULAR | Status: DC

## 2018-10-16 MED ORDER — LACTATED RINGERS IV SOLN
INTRAVENOUS | Status: DC
  Administered 2018-10-16: 07:00:00 via INTRAVENOUS

## 2018-10-16 MED ORDER — POTASSIUM CHLORIDE IN NACL 20-0.45 MEQ/L-% IV SOLN: INTRAVENOUS | Status: DC

## 2018-10-16 MED ORDER — SODIUM CHLORIDE 0.9 % IV SOLN
INTRAVENOUS | Status: DC | PRN
  Administered 2018-10-16: 08:00:00 30 mL

## 2018-10-16 MED ORDER — PROPOFOL 10 MG/ML IV BOLUS
INTRAVENOUS | Status: AC
  Filled 2018-10-16: qty 20

## 2018-10-16 MED ORDER — LIDOCAINE 2% (20 MG/ML) 5 ML SYRINGE
INTRAMUSCULAR | Status: AC
  Filled 2018-10-16: qty 5

## 2018-10-16 MED ORDER — ACETAMINOPHEN 500 MG PO TABS: 1000.0000 mg | ORAL_TABLET | Freq: Once | ORAL | Status: DC

## 2018-10-16 MED ORDER — METOPROLOL TARTRATE 5 MG/5ML IV SOLN: 5.0000 mg | Freq: Four times a day (QID) | INTRAVENOUS | Status: DC | PRN

## 2018-10-16 MED ORDER — OXYCODONE HCL 5 MG PO TABS: 5.0000 mg | ORAL_TABLET | Freq: Four times a day (QID) | ORAL | 0 refills | Status: DC | PRN

## 2018-10-16 MED ORDER — CEFAZOLIN SODIUM-DEXTROSE 2-4 GM/100ML-% IV SOLN
2.0000 g | Freq: Three times a day (TID) | INTRAVENOUS | Status: DC
  Filled 2018-10-16: qty 100

## 2018-10-16 MED ORDER — DEXAMETHASONE SODIUM PHOSPHATE 10 MG/ML IJ SOLN
INTRAMUSCULAR | Status: AC
  Filled 2018-10-16: qty 1

## 2018-10-16 MED ORDER — ONDANSETRON HCL 4 MG/2ML IJ SOLN
INTRAMUSCULAR | Status: AC
  Filled 2018-10-16: qty 2

## 2018-10-16 MED ORDER — MIDAZOLAM HCL 2 MG/2ML IJ SOLN: 0.5000 mg | Freq: Once | INTRAMUSCULAR | Status: DC | PRN

## 2018-10-16 MED ORDER — 0.9 % SODIUM CHLORIDE (POUR BTL) OPTIME
TOPICAL | Status: DC | PRN
  Administered 2018-10-16: 08:00:00 1000 mL

## 2018-10-16 MED ORDER — SCOPOLAMINE 1 MG/3DAYS TD PT72
MEDICATED_PATCH | TRANSDERMAL | Status: AC
  Filled 2018-10-16: qty 1

## 2018-10-16 SURGICAL SUPPLY — 49 items
BENZOIN TINCTURE PRP APPL 2/3 (GAUZE/BANDAGES/DRESSINGS) ×2 IMPLANT
BINDER BREAST LRG (GAUZE/BANDAGES/DRESSINGS) ×4 IMPLANT
BLADE SURG 15 STRL LF DISP TIS (BLADE) ×1 IMPLANT
BLADE SURG 15 STRL SS (BLADE) ×2
BNDG ELASTIC 6X5.8 VLCR STR LF (GAUZE/BANDAGES/DRESSINGS) IMPLANT
CANISTER SUCT 1200ML W/VALVE (MISCELLANEOUS) ×3 IMPLANT
CLEANER CAUTERY TIP 5X5 PAD (MISCELLANEOUS) IMPLANT
CLOSURE WOUND 1/2 X4 (GAUZE/BANDAGES/DRESSINGS) ×1
COVER BACK TABLE REUSABLE LG (DRAPES) ×3 IMPLANT
COVER MAYO STAND REUSABLE (DRAPES) ×3 IMPLANT
COVER WAND RF STERILE (DRAPES) IMPLANT
DECANTER SPIKE VIAL GLASS SM (MISCELLANEOUS) IMPLANT
DERMABOND ADVANCED (GAUZE/BANDAGES/DRESSINGS)
DERMABOND ADVANCED .7 DNX12 (GAUZE/BANDAGES/DRESSINGS) IMPLANT
DRAPE LAPAROTOMY 100X72 PEDS (DRAPES) ×3 IMPLANT
DRSG PAD ABDOMINAL 8X10 ST (GAUZE/BANDAGES/DRESSINGS) ×2 IMPLANT
ELECT COATED BLADE 2.86 ST (ELECTRODE) ×3 IMPLANT
ELECT REM PT RETURN 9FT ADLT (ELECTROSURGICAL) ×3
ELECTRODE REM PT RTRN 9FT ADLT (ELECTROSURGICAL) ×1 IMPLANT
GAUZE SPONGE 4X4 12PLY STRL (GAUZE/BANDAGES/DRESSINGS) ×2 IMPLANT
GLOVE BIO SURGEON STRL SZ8 (GLOVE) ×3 IMPLANT
GOWN STRL REUS W/ TWL LRG LVL3 (GOWN DISPOSABLE) ×1 IMPLANT
GOWN STRL REUS W/ TWL XL LVL3 (GOWN DISPOSABLE) ×1 IMPLANT
GOWN STRL REUS W/TWL LRG LVL3 (GOWN DISPOSABLE) ×2
GOWN STRL REUS W/TWL XL LVL3 (GOWN DISPOSABLE) ×2
NDL HYPO 25X1 1.5 SAFETY (NEEDLE) IMPLANT
NEEDLE HYPO 25X1 1.5 SAFETY (NEEDLE) IMPLANT
NS IRRIG 1000ML POUR BTL (IV SOLUTION) ×3 IMPLANT
PACK BASIN DAY SURGERY FS (CUSTOM PROCEDURE TRAY) ×3 IMPLANT
PAD CLEANER CAUTERY TIP 5X5 (MISCELLANEOUS)
PENCIL BUTTON HOLSTER BLD 10FT (ELECTRODE) ×3 IMPLANT
SLEEVE SCD COMPRESS KNEE MED (MISCELLANEOUS) IMPLANT
SPONGE LAP 18X18 RF (DISPOSABLE) ×2 IMPLANT
STRIP CLOSURE SKIN 1/2X4 (GAUZE/BANDAGES/DRESSINGS) ×1 IMPLANT
SUT MNCRL AB 4-0 PS2 18 (SUTURE) ×2 IMPLANT
SUT MON AB 4-0 PC3 18 (SUTURE) ×2 IMPLANT
SUT SILK 2 0 SH (SUTURE) ×3 IMPLANT
SUT VIC AB 4-0 SH 18 (SUTURE) ×3 IMPLANT
SUT VIC AB 5-0 P-3 18X BRD (SUTURE) ×1 IMPLANT
SUT VIC AB 5-0 P3 18 (SUTURE) ×2
SUT VICRYL 3-0 CR8 SH (SUTURE) ×2 IMPLANT
SYR BULB 3OZ (MISCELLANEOUS) ×3 IMPLANT
SYR CONTROL 10ML LL (SYRINGE) IMPLANT
TOWEL GREEN STERILE FF (TOWEL DISPOSABLE) ×3 IMPLANT
TRAY DSU PREP LF (CUSTOM PROCEDURE TRAY) ×3 IMPLANT
TRAY FAXITRON CT DISP (TRAY / TRAY PROCEDURE) ×3 IMPLANT
TUBE CONNECTING 20'X1/4 (TUBING) ×1
TUBE CONNECTING 20X1/4 (TUBING) ×2 IMPLANT
YANKAUER SUCT BULB TIP NO VENT (SUCTIONS) ×3 IMPLANT

## 2018-10-16 NOTE — Anesthesia Postprocedure Evaluation (Signed)
Anesthesia Post Note  Patient: Rhonda Rocha  Procedure(s) Performed: LEFT BREAST PARTIAL MASTECTOMY (Left Breast)     Patient location during evaluation: PACU Anesthesia Type: General Level of consciousness: awake and alert, patient cooperative and oriented Pain management: pain level controlled Vital Signs Assessment: post-procedure vital signs reviewed and stable Respiratory status: spontaneous breathing, nonlabored ventilation and respiratory function stable Cardiovascular status: blood pressure returned to baseline and stable Postop Assessment: no apparent nausea or vomiting Anesthetic complications: no    Last Vitals:  Vitals:   10/16/18 1130 10/16/18 1230  BP: 101/71 117/64  Pulse: 66 65  Resp: 16 16  Temp: 36.6 C   SpO2: 98% 100%    Last Pain:  Vitals:   10/16/18 1230  TempSrc:   PainSc: 2                  Keziah Drotar,E. Makyna Niehoff

## 2018-10-16 NOTE — Interval H&P Note (Signed)
History and Physical Interval Note:  10/16/2018 7:36 AM  Rhonda Rocha  has presented today for surgery, with the diagnosis of PAGET'S DISEASE OF NIPPLE, DCIS IN BREAST AREOLA.  The various methods of treatment have been discussed with the patient and family. After consideration of risks, benefits and other options for treatment, the patient has consented to  Procedure(s): LEFT BREAST PARTIAL MASTECTOMY (Left) as a surgical intervention.  The patient's history has been reviewed, patient examined, no change in status, stable for surgery.  I have reviewed the patient's chart and labs.  Questions were answered to the patient's satisfaction.     [name]

## 2018-10-16 NOTE — Discharge Instructions (Signed)
May remove breast binder tomorrow and shower Leave steri strips in place until they fall off Breast binder may be used for comfort with up and about May use ice pack or frozen peas in zip lock for ~ 48 hours as needed.  Post Anesthesia Home Care Instructions  Activity: Get plenty of rest for the remainder of the day. A responsible individual must stay with you for 24 hours following the procedure.  For the next 24 hours, DO NOT: -Drive a car -Paediatric nurse -Drink alcoholic beverages -Take any medication unless instructed by your physician -Make any legal decisions or sign important papers.  Meals: Start with liquid foods such as gelatin or soup. Progress to regular foods as tolerated. Avoid greasy, spicy, heavy foods. If nausea and/or vomiting occur, drink only clear liquids until the nausea and/or vomiting subsides. Call your physician if vomiting continues.  Special Instructions/Symptoms: Your throat may feel dry or sore from the anesthesia or the breathing tube placed in your throat during surgery. If this causes discomfort, gargle with warm salt water. The discomfort should disappear within 24 hours.  If you had a scopolamine patch placed behind your ear for the management of post- operative nausea and/or vomiting:  1. The medication in the patch is effective for 72 hours, after which it should be removed.  Wrap patch in a tissue and discard in the trash. Wash hands thoroughly with soap and water. 2. You may remove the patch earlier than 72 hours if you experience unpleasant side effects which may include dry mouth, dizziness or visual disturbances. 3. Avoid touching the patch. Wash your hands with soap and water after contact with the patch.       Information for Discharge Teaching: EXPAREL (bupivacaine liposome injectable suspension)   Your surgeon or anesthesiologist gave you EXPAREL(bupivacaine) to help control your pain after surgery.   EXPAREL is a local anesthetic  that provides pain relief by numbing the tissue around the surgical site.  EXPAREL is designed to release pain medication over time and can control pain for up to 72 hours.  Depending on how you respond to EXPAREL, you may require less pain medication during your recovery.  Possible side effects:  Temporary loss of sensation or ability to move in the area where bupivacaine was injected.  Nausea, vomiting, constipation  Rarely, numbness and tingling in your mouth or lips, lightheadedness, or anxiety may occur.  Call your doctor right away if you think you may be experiencing any of these sensations, or if you have other questions regarding possible side effects.  Follow all other discharge instructions given to you by your surgeon or nurse. Eat a healthy diet and drink plenty of water or other fluids.  If you return to the hospital for any reason within 96 hours following the administration of EXPAREL, it is important for health care providers to know that you have received this anesthetic. A teal colored band has been placed on your arm with the date, time and amount of EXPAREL you have received in order to alert and inform your health care providers. Please leave this armband in place for the full 96 hours following administration, and then you may remove the band.

## 2018-10-16 NOTE — Op Note (Signed)
Rhonda Rocha  08-11-1970 25 Sept 2020    PCP:  Eston Esters (Inactive)   Surgeon: Kaylyn Lim, MD, FACS  Asst:  none  Anes:  general  Preop Dx: Paget's disease of the left breast with DCIS Postop Dx: Same, permanent biopsy pending  Procedure: Left breast partial mastectomy including the nipple areolar complex Location Surgery: CDS 5 Complications: None noted  EBL:   minimal cc  Drains: none  Description of Procedure:  The patient was taken to OR 5 .  After anesthesia was administered and the patient was prepped  with [employer]icare and a timeout was performed.  The left breast had been marked and the nipple excoriation was about the same as at the time of biopsy.  PE revealed no palpable masses.  The superior and inferior margins of the areolar were marked and a 12 cm ellipse was described with the pen.  The incision was made and the cautery was used to raise slight flaps above and below and the resect the breast tissue beneath the nipple down to near the chest wall.  The specimen was marked with a single silk medially and two silks superiorly.  Hemostasis was present after the resection.  The area was irrigated and the area was injected with 30 cc Exparel.  Closure was with 3-0 vicryl, 4-0 vicryl, and then a running 4-0 Monocryl.  Benzoin and 1/2 inch steri strips were applied.  Fluffy dressing and breast binder were applied.    The patient tolerated the procedure well and was taken to the PACU in stable condition.  She will stay in recovery care overnight.     Matt B. Hassell Done, [place], [facility], [place]

## 2018-10-16 NOTE — Transfer of Care (Signed)
Immediate Anesthesia Transfer of Care Note  Patient: Rhonda Rocha  Procedure(s) Performed: LEFT BREAST PARTIAL MASTECTOMY (Left Breast)  Patient Location: PACU  Anesthesia Type:General  Level of Consciousness: awake, alert , oriented and patient cooperative  Airway & Oxygen Therapy: Patient Spontanous Breathing and Patient connected to nasal cannula oxygen  Post-op Assessment: Report given to RN and Post -op Vital signs reviewed and stable  Post vital signs: Reviewed and stable  Last Vitals:  Vitals Value Taken Time  BP    Temp    Pulse 78 10/16/18 0920  Resp 16 10/16/18 0920  SpO2 100 % 10/16/18 0920  Vitals shown include unvalidated device data.  Last Pain:  Vitals:   10/16/18 0707  TempSrc: Oral  PainSc: 0-No pain         Complications: No apparent anesthesia complications

## 2018-10-19 ENCOUNTER — Encounter (HOSPITAL_BASED_OUTPATIENT_CLINIC_OR_DEPARTMENT_OTHER): Payer: Self-pay | Admitting: Surgery

## 2018-10-19 ENCOUNTER — Ambulatory Visit: Payer: Managed Care, Other (non HMO) | Admitting: Hematology and Oncology

## 2018-10-20 NOTE — Addendum Note (Signed)
Encounter addended by: Kyung Rudd, MD on: 10/20/2018 3:07 PM  Actions taken: Clinical Note Signed

## 2018-10-22 ENCOUNTER — Telehealth: Payer: Managed Care, Other (non HMO)

## 2018-10-22 ENCOUNTER — Ambulatory Visit: Payer: Managed Care, Other (non HMO) | Admitting: Radiation Oncology

## 2018-10-25 NOTE — Progress Notes (Signed)
Patient Care Team: Eston Esters (Inactive) as PCP - General  DIAGNOSIS:    ICD-10-CM   1. Ductal carcinoma in situ (DCIS) of left breast  D05.12     SUMMARY OF ONCOLOGIC HISTORY: Oncology History  Ductal carcinoma in situ (DCIS) of left breast  09/09/2018 Initial Diagnosis   Ductal carcinoma in situ (DCIS) of left breast   09/24/2018 Genetic Testing   MET c.2609T>A and MSH2 c.508C>G VUS identified on the multi-cancer panel.  The Multi-Gene Panel offered by [facility] includes sequencing and/or deletion duplication testing of the following 85 genes: AIP, ALK, APC, ATM, AXIN2,BAP1,  BARD1, BLM, BMPR1A, BRCA1, BRCA2, BRIP1, CASR, CDC73, CDH1, CDK4, CDKN1B, CDKN1C, CDKN2A (p14ARF), CDKN2A (p16INK4a), CEBPA, CHEK2, CTNNA1, DICER1, DIS3L2, EGFR (c.2369C>T, p.Thr790Met variant only), EPCAM (Deletion/duplication testing only), FH, FLCN, GATA2, GPC3, GREM1 (Promoter region deletion/duplication testing only), HOXB13 (c.251G>A, p.Gly84Glu), HRAS, KIT, MAX, MEN1, MET, MITF (c.952G>A, p.Glu318Lys variant only), MLH1, MSH2, MSH3, MSH6, MUTYH, NBN, NF1, NF2, NTHL1, PALB2, PDGFRA, PHOX2B, PMS2, POLD1, POLE, POT1, PRKAR1A, PTCH1, PTEN, RAD50, RAD51C, RAD51D, RB1, RECQL4, RET, RNF43, RUNX1, SDHAF2, SDHA (sequence changes only), SDHB, SDHC, SDHD, SMAD4, SMARCA4, SMARCB1, SMARCE1, STK11, SUFU, TERC, TERT, TMEM127, TP53, TSC1, TSC2, VHL, WRN and WT1.  The report date is September 24, 2018.   10/16/2018 Surgery   Left lumpectomy Hassell Done): high grade DCIS, 2.4cm, clear margins.  ER PR negative     CHIEF COMPLIANT: Follow-up of left breast DCIS  INTERVAL HISTORY: Rhonda Rocha is a 48 y.o. 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 She underwent a lumpectomy on 10/16/18 with Dr. Hassell Done for which pathology showed high grade DCIS, 2.4cm, clear margins. She presents to the clinic today to review the pathology report and discuss further treatment.  Her major complaint is related to  postoperative discomfort and numbness in the left hand.  REVIEW OF SYSTEMS:   Constitutional: Denies fevers, chills or abnormal weight loss Eyes: Denies blurriness of vision Ears, nose, mouth, throat, and face: Denies mucositis or sore throat Respiratory: Denies cough, dyspnea or wheezes Cardiovascular: Denies palpitation, chest discomfort Gastrointestinal: Denies nausea, heartburn or change in bowel habits Skin: Denies abnormal skin rashes Lymphatics: Denies new lymphadenopathy or easy bruising Neurological: Denies numbness, tingling or new weaknesses Behavioral/Psych: Mood is stable, no new changes  Extremities: No lower extremity edema Breast: s/p left lumpectomy All other systems were reviewed with the patient and are negative.  I have reviewed the past medical history, past surgical history, social history and family history with the patient and they are unchanged from previous note.  ALLERGIES:  has No Known Allergies.  MEDICATIONS:  Current Outpatient Medications  Medication Sig Dispense Refill  . acetaminophen (TYLENOL) 325 MG tablet Take 650 mg by mouth every 6 (six) hours as needed.    Marland Kitchen ibuprofen (ADVIL) 400 MG tablet Take 400 mg by mouth every 6 (six) hours as needed.    Marland Kitchen oxyCODONE (OXY IR/ROXICODONE) 5 MG immediate release tablet Take 1 tablet (5 mg total) by mouth every 6 (six) hours as needed for severe pain. 15 tablet 0   No current facility-administered medications for this visit.     PHYSICAL EXAMINATION: ECOG PERFORMANCE STATUS: 1 - Symptomatic but completely ambulatory  Vitals:   10/26/18 1513  BP: 118/78  Pulse: 71  Resp: 16  Temp: 99.1 F (37.3 C)  SpO2: 100%   Filed Weights   10/26/18 1513  Weight: 172 lb 4.8 oz (78.2 kg)    GENERAL: alert, no distress and  comfortable SKIN: skin color, texture, turgor are normal, no rashes or significant lesions EYES: normal, Conjunctiva are pink and non-injected, sclera clear OROPHARYNX: no exudate, no  erythema and lips, buccal mucosa, and tongue normal  NECK: supple, thyroid normal size, non-tender, without nodularity LYMPH: no palpable lymphadenopathy in the cervical, axillary or inguinal LUNGS: clear to auscultation and percussion with normal breathing effort HEART: regular rate & rhythm and no murmurs and no lower extremity edema ABDOMEN: abdomen soft, non-tender and normal bowel sounds MUSCULOSKELETAL: no cyanosis of digits and no clubbing  NEURO: alert & oriented x 3 with fluent speech, no focal motor/sensory deficits EXTREMITIES: No lower extremity edema  LABORATORY DATA:  I have reviewed the data as listed CMP Latest Ref Rng & Units 11/09/2014 01/01/2013 01/01/2013  Glucose 65 - 99 mg/dL 129(H) 83 84  BUN 6 - 20 mg/dL 8 9 10   Creatinine 0.44 - 1.00 mg/dL 0.82 0.90 0.76  Sodium 135 - 145 mmol/L 134(L) 142 140  Potassium 3.5 - 5.1 mmol/L 3.4(L) 3.7 3.9  Chloride 101 - 111 mmol/L 101 105 105  CO2 22 - 32 mmol/L 25 - 24  Calcium 8.9 - 10.3 mg/dL 8.5(L) - 8.9  Total Protein 6.5 - 8.1 g/dL 6.7 - 7.4  Total Bilirubin 0.3 - 1.2 mg/dL 0.5 - 0.5  Alkaline Phos 38 - 126 U/L 34(L) - 46  AST 15 - 41 U/L 25 - 24  ALT 14 - 54 U/L 15 - 15    Lab Results  Component Value Date   WBC 6.4 11/09/2014   HGB 12.1 11/09/2014   HCT 35.2 (L) 11/09/2014   MCV 88.4 11/09/2014   PLT 227 11/09/2014   NEUTROABS 3.4 01/01/2013    ASSESSMENT & PLAN:  Ductal carcinoma in situ (DCIS) of left breast Left lumpectomy Hassell Done): high grade DCIS, 2.4cm, clear margins.  ER negative, PR negative Tis NX stage 0  Pathology review: Discuss final pathology report provided with a copy of the result. Treatment plan: Adjuvant radiation therapy followed with surveillance.  Because she is ER PR negative, she will follow-up with survivorship for long-term follow-up as well.   Return to clinic in 3 months for survivorship care plan visit.    No orders of the defined types were placed in this encounter.  The  patient has a good understanding of the overall plan. she agrees with it. she will call with any problems that may develop before the next visit here.  Nicholas Lose, MD 10/26/2018  Julious Oka Dorshimer am acting as scribe for Dr. Nicholas Lose.  I have reviewed the above documentation for accuracy and completeness, and I agree with the above.

## 2018-10-26 ENCOUNTER — Inpatient Hospital Stay: Payer: Managed Care, Other (non HMO) | Attending: Hematology and Oncology | Admitting: Hematology and Oncology

## 2018-10-26 ENCOUNTER — Other Ambulatory Visit: Payer: Self-pay

## 2018-10-26 DIAGNOSIS — Z171 Estrogen receptor negative status [ER-]: Secondary | ICD-10-CM | POA: Insufficient documentation

## 2018-10-26 DIAGNOSIS — D0512 Intraductal carcinoma in situ of left breast: Secondary | ICD-10-CM | POA: Insufficient documentation

## 2018-10-26 NOTE — Assessment & Plan Note (Signed)
Left lumpectomy Rhonda Rocha): high grade DCIS, 2.4cm, clear margins.  ER negative, PR negative Tis NX stage 0  Pathology review: Discuss final pathology report provided with a copy of the result. Treatment plan: Adjuvant radiation therapy followed with surveillance.  Return to clinic in 3 months for survivorship care plan visit.

## 2018-10-28 ENCOUNTER — Telehealth: Payer: Self-pay | Admitting: Adult Health

## 2018-10-28 NOTE — Telephone Encounter (Signed)
I talk with patient regarding schedule  

## 2018-11-02 ENCOUNTER — Other Ambulatory Visit: Payer: Self-pay

## 2018-11-03 LAB — SURGICAL PATHOLOGY

## 2018-11-03 NOTE — Progress Notes (Addendum)
Radiation Oncology         (336) [phone] ________________________________  Name: Rhonda Rocha        MRN: [id number]  Date of Service: 11/04/2018 DOB: Jun 26, 1970  [id number], Monica (Inactive)  [place], [place]     REFERRING PHYSICIAN: [place], [place]   DIAGNOSIS: The encounter diagnosis was Ductal carcinoma in situ (DCIS) of left breast.   HISTORY OF PRESENT ILLNESS: Rhonda Rocha is a 48 y.o. female originally seen for a new diagnosis of left breast cancer. The patient was noted to have some suspicious changes within the left breast and underwent biopsy on 08/21/2018 of the nipple region.  This revealed Paget's disease/DCIS of the left breast.  The patient subsequently has proceeded with an MRI scan of the breast bilaterally on 09/03/2018.  No suspicious findings on the right.  Left nipple enhancement was seen consistent with the previous biopsy and the associated findings.  A small area was also seen very superficial below the skin in the lower inner quadrant.  This was felt to possibly represent an intramammary lymph node although further evaluation with ultrasound was recommended. She discussed this further with surgery and proceeded to lumpectomy on 10/16/2018 that revealed a high grade DCIS and her margins were negative. The entire area of DCIS measured 2.8 cm, and her tumor was ER/PR negative. She is seen today via MyChart to discuss adjuvant treatment options with radiotherapy.    PREVIOUS RADIATION THERAPY: No   PAST MEDICAL HISTORY:  Past Medical History:  Diagnosis Date   Cancer ([place])    Family history of breast cancer    Family history of prostate cancer    Family history of stomach cancer        PAST SURGICAL HISTORY: Past Surgical History:  Procedure Laterality Date   BREAST BIOPSY Left 08/21/2018   Procedure: LEFT BREAST BIOPSY;  Surgeon: Johnathan Hausen, MD;  Location: [facility];  Service: General;  Laterality: Left;    CESAREAN SECTION WITH BILATERAL TUBAL LIGATION  2011   MASTECTOMY, PARTIAL Left 10/16/2018   Procedure: LEFT BREAST PARTIAL MASTECTOMY;  Surgeon: Johnathan Hausen, MD;  Location: [facility];  Service: General;  Laterality: Left;   Uterine Ablation       FAMILY HISTORY:  Family History  Problem Relation Age of Onset   Healthy Mother    Other Father 40       "murdered"   Breast cancer Maternal Aunt    Prostate cancer Maternal Uncle    Breast cancer Paternal Aunt 73   Breast cancer Maternal Aunt 45   Prostate cancer Maternal Uncle 66       d. 29   Stomach cancer Cousin 45     SOCIAL HISTORY:  reports that she has never smoked. She has never used smokeless tobacco. She reports that she does not drink alcohol or use drugs. The patient is single and lives in [place]. 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    ALLERGIES: Patient has no known allergies.   MEDICATIONS:  Current Outpatient Medications  Medication Sig Dispense Refill   acetaminophen (TYLENOL) 325 MG tablet Take 650 mg by mouth every 6 (six) hours as needed.     ibuprofen (ADVIL) 400 MG tablet Take 400 mg by mouth every 6 (six) hours as needed.     oxyCODONE (OXY IR/ROXICODONE) 5 MG immediate release tablet Take 1 tablet (5 mg total) by mouth every 6 (six) hours as needed for severe pain. (Patient not taking:  Reported on 11/04/2018) 15 tablet 0   No current facility-administered medications for this encounter.      REVIEW OF SYSTEMS: On review of systems, the patient reports that she is doing well overall. She is a bit sore but not having any drainage or redness of the breast. She denies any chest pain, shortness of breath, cough, fevers, chills, night sweats, unintended weight changes. She denies any bowel or bladder disturbances, and denies abdominal pain, nausea or vomiting. She denies any new musculoskeletal or joint aches or pains. A complete review of systems is obtained and is  otherwise negative.     PHYSICAL EXAM:  Vitals unable to be assessed due to encounter type.  In general this is a well appearing African American female in no acute distress. She's alert and oriented x4 and appropriate throughout the examination. Cardiopulmonary assessment is negative for acute distress and she exhibits normal effort.    ECOG = 0  0 - Asymptomatic (Fully active, able to carry on all predisease activities without restriction)  1 - Symptomatic but completely ambulatory (Restricted in physically strenuous activity but ambulatory and able to carry out work of a light or sedentary nature. For example, light housework, office work)  2 - Symptomatic, <50% in bed during the day (Ambulatory and capable of all self care but unable to carry out any work activities. Up and about more than 50% of waking hours)  3 - Symptomatic, >50% in bed, but not bedbound (Capable of only limited self-care, confined to bed or chair 50% or more of waking hours)  4 - Bedbound (Completely disabled. Cannot carry on any self-care. Totally confined to bed or chair)  5 - Death   Eustace Pen MM, Creech RH, Tormey DC, et al. [phone]). "Toxicity and response criteria of the [facility]". [place] Oncol. 5 (6): 649-55    LABORATORY DATA:  Lab Results  Component Value Date   WBC 6.4 11/09/2014   HGB 12.1 11/09/2014   HCT 35.2 (L) 11/09/2014   MCV 88.4 11/09/2014   PLT 227 11/09/2014   Lab Results  Component Value Date   NA 134 (L) 11/09/2014   K 3.4 (L) 11/09/2014   CL 101 11/09/2014   CO2 25 11/09/2014   Lab Results  Component Value Date   ALT 15 11/09/2014   AST 25 11/09/2014   ALKPHOS 34 (L) 11/09/2014   BILITOT 0.5 11/09/2014      RADIOGRAPHY: No results found.     IMPRESSION/PLAN: 1. ER/PR negative high grade DCIS of the left breast. Dr. Lisbeth Renshaw discusses the pathology findings and reviews the nature of noninvasive left breast disease. She is healing and is at  the appropriate time interval to proceed with adjuvant radiotherapy. This would help reduce the chance of local recurrence. We discussed the risks, benefits, short, and long term effects of radiotherapy, and the patient is interested in proceeding. Dr. Lisbeth Renshaw discusses the delivery and logistics of radiotherapy and anticipates a course of 6 1/2 weeks of radiotherapy with deep inspiration breath hold technique. She will simulate today and sign consent at that time to proceed. 2. Contraceptive Counseling. The patient reports she did have a bilateral tubal ligation at the time of her C-section and I've updated her surgical history to reflect that. She does not need urine hcg given surgical sterilization.     This encounter was provided by telemedicine platform MyChart.  The patient has given verbal consent for this type of encounter and has been advised  to only accept a meeting of this type in a secure network environment. The time spent during this encounter was 30 minutes. The attendants for this meeting include Mardene Sayer, LPN, Dr. Lisbeth Renshaw, Hayden Pedro  and Gwynneth Aliment.  During the encounter,  Mardene Sayer, LPN, Dr. Lisbeth Renshaw, and Hayden Pedro were located at [facility] Radiation Oncology Department.  Nakema ROSY CURL was located at home with her young daughter and granddaughter.   The above documentation reflects my direct findings during this shared patient visit. Please see the separate note by Dr. Lisbeth Renshaw on this date for the remainder of the patient's plan of care.    Carola Rhine, PAC

## 2018-11-04 ENCOUNTER — Encounter: Payer: Self-pay | Admitting: *Deleted

## 2018-11-04 ENCOUNTER — Ambulatory Visit
Admission: RE | Admit: 2018-11-04 | Discharge: 2018-11-04 | Disposition: A | Discharge status: Home / Self Care | Payer: Managed Care, Other (non HMO) | Source: Ambulatory Visit | Attending: Radiation Oncology | Admitting: Radiation Oncology

## 2018-11-04 ENCOUNTER — Other Ambulatory Visit: Payer: Self-pay

## 2018-11-04 DIAGNOSIS — D0512 Intraductal carcinoma in situ of left breast: Secondary | ICD-10-CM

## 2018-11-05 NOTE — Progress Notes (Signed)
  Radiation Oncology         [phone]) [phone] ________________________________  Name: Rhonda Rocha MRN: [id number]  Date: 11/04/2018  DOB: [birth year]  Optical Surface Tracking Plan:  Since intensity modulated radiotherapy (IMRT) and 3D conformal radiation treatment methods are predicated on accurate and precise positioning for treatment, intrafraction motion monitoring is medically necessary to ensure accurate and safe treatment delivery.  The ability to quantify intrafraction motion without excessive ionizing radiation dose can only be performed with optical surface tracking. Accordingly, surface imaging offers the opportunity to obtain 3D measurements of patient position throughout IMRT and 3D treatments without excessive radiation exposure.  I am ordering optical surface tracking for this patient's upcoming course of radiotherapy. ________________________________  Kyung Rudd, MD 11/05/2018 4:30 PM    Reference:   Ursula Alert, J, et al. Surface imaging-based analysis of intrafraction motion for breast radiotherapy patients.Journal of [employer], n. 6, nov. 2014. ISSN [id number].   Available at: <http://www.jacmp.org/index.php/jacmp/article/view/4957>.

## 2018-11-05 NOTE — Progress Notes (Signed)
  Radiation Oncology         (336) [phone] ________________________________  Name: Rhonda Rocha MRN: [id number]  Date: 11/04/2018  DOB: 01/10/1971  DIAGNOSIS:     ICD-10-CM   1. Ductal carcinoma in situ (DCIS) of left breast  D05.12      SIMULATION AND TREATMENT PLANNING NOTE  The patient presented for simulation prior to beginning her course of radiation treatment for her diagnosis of left-sided breast cancer. The patient was placed in a supine position on a breast board. A customized vac-lock bag was constructed and this complex treatment device will be used on a daily basis during her treatment. In this fashion, a CT scan was obtained through the chest area and an isocenter was placed near the chest wall within the breast.  The patient will be planned to receive a course of radiation initially to a dose of 50.4 Gy. This will consist of a whole breast radiotherapy technique. To accomplish this, 2 customized blocks have been designed which will correspond to medial and lateral whole breast tangent fields. This treatment will be accomplished at 1.8 Gy per fraction. A forward planning technique will also be evaluated to determine if this approach improves the plan. It is anticipated that the patient will then receive a 10 Gy boost to the seroma cavity which has been contoured. This will be accomplished at 2 Gy per fraction.   This initial treatment will consist of a 3-D conformal technique. The seroma has been contoured as the primary target structure. Additionally, dose volume histograms of both this target as well as the lungs and heart will also be evaluated. Such an approach is necessary to ensure that the target area is adequately covered while the nearby critical normal structures are adequately spared.  Plan:  The final anticipated total dose therefore will correspond to 60.4 Gy.   Special treatment procedure was performed today due to the extra time and effort required by myself to  plan and prepare this patient for deep inspiration breath hold technique.  I have determined cardiac sparing to be of benefit to this patient to prevent long term cardiac damage due to radiation of the heart.  Bellows were placed on the patient's abdomen. To facilitate cardiac sparing, the patient was coached by the radiation therapists on breath hold techniques and breathing practice was performed. Practice waveforms were obtained. The patient was then scanned while maintaining breath hold in the treatment position.  This image was then transferred over to the imaging specialist. The imaging specialist then created a fusion of the free breathing and breath hold scans using the chest wall as the stable structure. I personally reviewed the fusion in axial, coronal and sagittal image planes.  Excellent cardiac sparing was obtained.  I felt the patient is an appropriate candidate for breath hold and the patient will be treated as such.  The image fusion was then reviewed with the patient to reinforce the necessity of reproducible breath hold.     _______________________________   Jodelle Gross, MD, PhD

## 2018-11-10 DIAGNOSIS — D0512 Intraductal carcinoma in situ of left breast: Secondary | ICD-10-CM | POA: Diagnosis not present

## 2018-11-16 ENCOUNTER — Ambulatory Visit
Admission: RE | Admit: 2018-11-16 | Discharge: 2018-11-16 | Disposition: A | Discharge status: Home / Self Care | Payer: Managed Care, Other (non HMO) | Source: Ambulatory Visit | Attending: Radiation Oncology | Admitting: Radiation Oncology

## 2018-11-16 ENCOUNTER — Other Ambulatory Visit: Payer: Self-pay

## 2018-11-16 DIAGNOSIS — D0512 Intraductal carcinoma in situ of left breast: Secondary | ICD-10-CM | POA: Diagnosis not present

## 2018-11-17 ENCOUNTER — Other Ambulatory Visit: Payer: Self-pay

## 2018-11-17 ENCOUNTER — Ambulatory Visit
Admission: RE | Admit: 2018-11-17 | Discharge: 2018-11-17 | Disposition: A | Discharge status: Home / Self Care | Payer: Managed Care, Other (non HMO) | Source: Ambulatory Visit | Attending: Radiation Oncology | Admitting: Radiation Oncology

## 2018-11-17 DIAGNOSIS — D0512 Intraductal carcinoma in situ of left breast: Secondary | ICD-10-CM | POA: Diagnosis not present

## 2018-11-18 ENCOUNTER — Ambulatory Visit
Admission: RE | Admit: 2018-11-18 | Discharge: 2018-11-18 | Disposition: A | Discharge status: Home / Self Care | Payer: Managed Care, Other (non HMO) | Source: Ambulatory Visit | Attending: Radiation Oncology | Admitting: Radiation Oncology

## 2018-11-18 DIAGNOSIS — D0512 Intraductal carcinoma in situ of left breast: Secondary | ICD-10-CM | POA: Diagnosis not present

## 2018-11-19 ENCOUNTER — Other Ambulatory Visit: Payer: Self-pay

## 2018-11-19 ENCOUNTER — Ambulatory Visit
Admission: RE | Admit: 2018-11-19 | Discharge: 2018-11-19 | Disposition: A | Discharge status: Home / Self Care | Payer: Managed Care, Other (non HMO) | Source: Ambulatory Visit | Attending: Radiation Oncology | Admitting: Radiation Oncology

## 2018-11-19 DIAGNOSIS — D0512 Intraductal carcinoma in situ of left breast: Secondary | ICD-10-CM | POA: Diagnosis not present

## 2018-11-20 ENCOUNTER — Other Ambulatory Visit: Payer: Self-pay

## 2018-11-20 ENCOUNTER — Ambulatory Visit
Admission: RE | Admit: 2018-11-20 | Discharge: 2018-11-20 | Disposition: A | Discharge status: Home / Self Care | Payer: Managed Care, Other (non HMO) | Source: Ambulatory Visit | Attending: Radiation Oncology | Admitting: Radiation Oncology

## 2018-11-20 DIAGNOSIS — D0512 Intraductal carcinoma in situ of left breast: Secondary | ICD-10-CM | POA: Diagnosis not present

## 2018-11-20 MED ORDER — ALRA NON-METALLIC DEODORANT (RAD-ONC)
1.0000 "application " | Freq: Once | TOPICAL | Status: AC
  Administered 2018-11-20: 1 via TOPICAL

## 2018-11-20 MED ORDER — RADIAPLEXRX EX GEL
Freq: Once | CUTANEOUS | Status: AC
  Administered 2018-11-20: 16:00:00 via TOPICAL

## 2018-11-20 NOTE — Progress Notes (Signed)
Pt here for patient teaching.  Pt given Radiation and You booklet, skin care instructions, Alra deodorant and Radiaplex gel.  Reviewed areas of pertinence such as fatigue, hair loss, skin changes, breast tenderness and breast swelling . Pt able to give teach back of to pat skin and use unscented/gentle soap,apply Radiaplex bid, avoid applying anything to skin within 4 hours of treatment, avoid wearing an under wire bra and to use an electric razor if they must shave. Pt verbalizes understanding of information given and will contact nursing with any questions or concerns.     Rhonda Liotta M. Guerino Caporale RN, BSN      

## 2018-11-23 ENCOUNTER — Ambulatory Visit
Admission: RE | Admit: 2018-11-23 | Discharge: 2018-11-23 | Disposition: A | Discharge status: Home / Self Care | Payer: Managed Care, Other (non HMO) | Source: Ambulatory Visit | Attending: Radiation Oncology | Admitting: Radiation Oncology

## 2018-11-23 ENCOUNTER — Other Ambulatory Visit: Payer: Self-pay

## 2018-11-23 DIAGNOSIS — D0512 Intraductal carcinoma in situ of left breast: Secondary | ICD-10-CM | POA: Insufficient documentation

## 2018-11-24 ENCOUNTER — Ambulatory Visit
Admission: RE | Admit: 2018-11-24 | Discharge: 2018-11-24 | Disposition: A | Discharge status: Home / Self Care | Payer: Managed Care, Other (non HMO) | Source: Ambulatory Visit | Attending: Radiation Oncology | Admitting: Radiation Oncology

## 2018-11-24 ENCOUNTER — Other Ambulatory Visit: Payer: Self-pay

## 2018-11-24 DIAGNOSIS — D0512 Intraductal carcinoma in situ of left breast: Secondary | ICD-10-CM | POA: Diagnosis not present

## 2018-11-25 ENCOUNTER — Other Ambulatory Visit: Payer: Self-pay

## 2018-11-25 ENCOUNTER — Ambulatory Visit
Admission: RE | Admit: 2018-11-25 | Discharge: 2018-11-25 | Disposition: A | Discharge status: Home / Self Care | Payer: Managed Care, Other (non HMO) | Source: Ambulatory Visit | Attending: Radiation Oncology | Admitting: Radiation Oncology

## 2018-11-25 DIAGNOSIS — D0512 Intraductal carcinoma in situ of left breast: Secondary | ICD-10-CM | POA: Diagnosis not present

## 2018-11-26 ENCOUNTER — Ambulatory Visit
Admission: RE | Admit: 2018-11-26 | Discharge: 2018-11-26 | Disposition: A | Discharge status: Home / Self Care | Payer: Managed Care, Other (non HMO) | Source: Ambulatory Visit | Attending: Radiation Oncology | Admitting: Radiation Oncology

## 2018-11-26 ENCOUNTER — Other Ambulatory Visit: Payer: Self-pay

## 2018-11-26 DIAGNOSIS — D0512 Intraductal carcinoma in situ of left breast: Secondary | ICD-10-CM | POA: Diagnosis not present

## 2018-11-27 ENCOUNTER — Ambulatory Visit
Admission: RE | Admit: 2018-11-27 | Discharge: 2018-11-27 | Disposition: A | Discharge status: Home / Self Care | Payer: Managed Care, Other (non HMO) | Source: Ambulatory Visit | Attending: Radiation Oncology | Admitting: Radiation Oncology

## 2018-11-27 ENCOUNTER — Other Ambulatory Visit: Payer: Self-pay

## 2018-11-27 DIAGNOSIS — D0512 Intraductal carcinoma in situ of left breast: Secondary | ICD-10-CM | POA: Diagnosis not present

## 2018-11-30 ENCOUNTER — Other Ambulatory Visit: Payer: Self-pay

## 2018-11-30 ENCOUNTER — Ambulatory Visit
Admission: RE | Admit: 2018-11-30 | Discharge: 2018-11-30 | Disposition: A | Discharge status: Home / Self Care | Payer: Managed Care, Other (non HMO) | Source: Ambulatory Visit | Attending: Radiation Oncology | Admitting: Radiation Oncology

## 2018-11-30 DIAGNOSIS — D0512 Intraductal carcinoma in situ of left breast: Secondary | ICD-10-CM | POA: Diagnosis not present

## 2018-12-01 ENCOUNTER — Ambulatory Visit
Admission: RE | Admit: 2018-12-01 | Discharge: 2018-12-01 | Disposition: A | Discharge status: Home / Self Care | Payer: Managed Care, Other (non HMO) | Source: Ambulatory Visit | Attending: Radiation Oncology | Admitting: Radiation Oncology

## 2018-12-01 DIAGNOSIS — D0512 Intraductal carcinoma in situ of left breast: Secondary | ICD-10-CM | POA: Diagnosis not present

## 2018-12-02 ENCOUNTER — Ambulatory Visit
Admission: RE | Admit: 2018-12-02 | Discharge: 2018-12-02 | Disposition: A | Discharge status: Home / Self Care | Payer: Managed Care, Other (non HMO) | Source: Ambulatory Visit | Attending: Radiation Oncology | Admitting: Radiation Oncology

## 2018-12-02 ENCOUNTER — Other Ambulatory Visit: Payer: Self-pay

## 2018-12-02 DIAGNOSIS — D0512 Intraductal carcinoma in situ of left breast: Secondary | ICD-10-CM | POA: Diagnosis not present

## 2018-12-03 ENCOUNTER — Ambulatory Visit: Payer: Managed Care, Other (non HMO)

## 2018-12-04 ENCOUNTER — Ambulatory Visit: Payer: Managed Care, Other (non HMO)

## 2018-12-07 ENCOUNTER — Ambulatory Visit
Admission: RE | Admit: 2018-12-07 | Discharge: 2018-12-07 | Disposition: A | Discharge status: Home / Self Care | Payer: Managed Care, Other (non HMO) | Source: Ambulatory Visit | Attending: Radiation Oncology | Admitting: Radiation Oncology

## 2018-12-07 ENCOUNTER — Other Ambulatory Visit: Payer: Self-pay

## 2018-12-07 DIAGNOSIS — D0512 Intraductal carcinoma in situ of left breast: Secondary | ICD-10-CM | POA: Diagnosis not present

## 2018-12-08 ENCOUNTER — Other Ambulatory Visit: Payer: Self-pay

## 2018-12-08 ENCOUNTER — Ambulatory Visit
Admission: RE | Admit: 2018-12-08 | Discharge: 2018-12-08 | Disposition: A | Discharge status: Home / Self Care | Payer: Managed Care, Other (non HMO) | Source: Ambulatory Visit | Attending: Radiation Oncology | Admitting: Radiation Oncology

## 2018-12-08 DIAGNOSIS — D0512 Intraductal carcinoma in situ of left breast: Secondary | ICD-10-CM | POA: Diagnosis not present

## 2018-12-09 ENCOUNTER — Other Ambulatory Visit: Payer: Self-pay

## 2018-12-09 ENCOUNTER — Ambulatory Visit
Admission: RE | Admit: 2018-12-09 | Discharge: 2018-12-09 | Disposition: A | Discharge status: Home / Self Care | Payer: Managed Care, Other (non HMO) | Source: Ambulatory Visit | Attending: Radiation Oncology | Admitting: Radiation Oncology

## 2018-12-09 DIAGNOSIS — D0512 Intraductal carcinoma in situ of left breast: Secondary | ICD-10-CM | POA: Diagnosis not present

## 2018-12-10 ENCOUNTER — Ambulatory Visit
Admission: RE | Admit: 2018-12-10 | Discharge: 2018-12-10 | Disposition: A | Discharge status: Home / Self Care | Payer: Managed Care, Other (non HMO) | Source: Ambulatory Visit | Attending: Radiation Oncology | Admitting: Radiation Oncology

## 2018-12-10 ENCOUNTER — Other Ambulatory Visit: Payer: Self-pay

## 2018-12-10 DIAGNOSIS — D0512 Intraductal carcinoma in situ of left breast: Secondary | ICD-10-CM | POA: Diagnosis not present

## 2018-12-11 ENCOUNTER — Ambulatory Visit
Admission: RE | Admit: 2018-12-11 | Discharge: 2018-12-11 | Disposition: A | Discharge status: Home / Self Care | Payer: Managed Care, Other (non HMO) | Source: Ambulatory Visit | Attending: Radiation Oncology | Admitting: Radiation Oncology

## 2018-12-11 ENCOUNTER — Ambulatory Visit: Payer: Managed Care, Other (non HMO) | Admitting: Radiation Oncology

## 2018-12-11 ENCOUNTER — Other Ambulatory Visit: Payer: Self-pay

## 2018-12-11 DIAGNOSIS — D0512 Intraductal carcinoma in situ of left breast: Secondary | ICD-10-CM | POA: Diagnosis not present

## 2018-12-13 ENCOUNTER — Ambulatory Visit: Payer: Managed Care, Other (non HMO)

## 2018-12-14 ENCOUNTER — Ambulatory Visit
Admission: RE | Admit: 2018-12-14 | Discharge: 2018-12-14 | Disposition: A | Discharge status: Home / Self Care | Payer: Managed Care, Other (non HMO) | Source: Ambulatory Visit | Attending: Radiation Oncology | Admitting: Radiation Oncology

## 2018-12-14 ENCOUNTER — Other Ambulatory Visit: Payer: Self-pay

## 2018-12-14 DIAGNOSIS — D0512 Intraductal carcinoma in situ of left breast: Secondary | ICD-10-CM | POA: Diagnosis not present

## 2018-12-15 ENCOUNTER — Other Ambulatory Visit: Payer: Self-pay

## 2018-12-15 ENCOUNTER — Ambulatory Visit
Admission: RE | Admit: 2018-12-15 | Discharge: 2018-12-15 | Disposition: A | Discharge status: Home / Self Care | Payer: Managed Care, Other (non HMO) | Source: Ambulatory Visit | Attending: Radiation Oncology | Admitting: Radiation Oncology

## 2018-12-15 DIAGNOSIS — D0512 Intraductal carcinoma in situ of left breast: Secondary | ICD-10-CM | POA: Diagnosis not present

## 2018-12-16 ENCOUNTER — Ambulatory Visit
Admission: RE | Admit: 2018-12-16 | Discharge: 2018-12-16 | Disposition: A | Discharge status: Home / Self Care | Payer: Managed Care, Other (non HMO) | Source: Ambulatory Visit | Attending: Radiation Oncology | Admitting: Radiation Oncology

## 2018-12-16 ENCOUNTER — Ambulatory Visit
Admission: RE | Admit: 2018-12-16 | Payer: Managed Care, Other (non HMO) | Source: Ambulatory Visit | Admitting: Radiation Oncology

## 2018-12-16 ENCOUNTER — Other Ambulatory Visit: Payer: Self-pay

## 2018-12-16 DIAGNOSIS — D0512 Intraductal carcinoma in situ of left breast: Secondary | ICD-10-CM | POA: Diagnosis not present

## 2018-12-21 ENCOUNTER — Other Ambulatory Visit: Payer: Self-pay

## 2018-12-21 ENCOUNTER — Ambulatory Visit
Admission: RE | Admit: 2018-12-21 | Discharge: 2018-12-21 | Disposition: A | Discharge status: Home / Self Care | Payer: Managed Care, Other (non HMO) | Source: Ambulatory Visit | Attending: Radiation Oncology | Admitting: Radiation Oncology

## 2018-12-21 DIAGNOSIS — D0512 Intraductal carcinoma in situ of left breast: Secondary | ICD-10-CM | POA: Diagnosis not present

## 2018-12-22 ENCOUNTER — Ambulatory Visit: Payer: Managed Care, Other (non HMO) | Admitting: Radiation Oncology

## 2018-12-22 ENCOUNTER — Ambulatory Visit
Admission: RE | Admit: 2018-12-22 | Discharge: 2018-12-22 | Disposition: A | Discharge status: Home / Self Care | Payer: Managed Care, Other (non HMO) | Source: Ambulatory Visit | Attending: Radiation Oncology | Admitting: Radiation Oncology

## 2018-12-22 ENCOUNTER — Other Ambulatory Visit: Payer: Self-pay

## 2018-12-22 DIAGNOSIS — D0512 Intraductal carcinoma in situ of left breast: Secondary | ICD-10-CM | POA: Diagnosis not present

## 2018-12-23 ENCOUNTER — Other Ambulatory Visit: Payer: Self-pay

## 2018-12-23 ENCOUNTER — Ambulatory Visit
Admission: RE | Admit: 2018-12-23 | Discharge: 2018-12-23 | Disposition: A | Discharge status: Home / Self Care | Payer: Managed Care, Other (non HMO) | Source: Ambulatory Visit | Attending: Radiation Oncology | Admitting: Radiation Oncology

## 2018-12-23 ENCOUNTER — Telehealth: Payer: Self-pay | Admitting: *Deleted

## 2018-12-23 DIAGNOSIS — D0512 Intraductal carcinoma in situ of left breast: Secondary | ICD-10-CM | POA: Diagnosis not present

## 2018-12-23 NOTE — Telephone Encounter (Signed)
Connected with patient.  Corrected 11/26/2018 FMLA to intermittent leave as requested by patient.  Original at first floor registration desk for pick-up before today's 3:30 pm appointment.

## 2018-12-24 ENCOUNTER — Ambulatory Visit
Admission: RE | Admit: 2018-12-24 | Discharge: 2018-12-24 | Disposition: A | Discharge status: Home / Self Care | Payer: Managed Care, Other (non HMO) | Source: Ambulatory Visit | Attending: Radiation Oncology | Admitting: Radiation Oncology

## 2018-12-24 ENCOUNTER — Ambulatory Visit: Payer: Managed Care, Other (non HMO)

## 2018-12-24 ENCOUNTER — Other Ambulatory Visit: Payer: Self-pay

## 2018-12-24 DIAGNOSIS — D0512 Intraductal carcinoma in situ of left breast: Secondary | ICD-10-CM | POA: Diagnosis not present

## 2018-12-25 ENCOUNTER — Other Ambulatory Visit: Payer: Self-pay

## 2018-12-25 ENCOUNTER — Ambulatory Visit
Admission: RE | Admit: 2018-12-25 | Discharge: 2018-12-25 | Disposition: A | Discharge status: Home / Self Care | Payer: Managed Care, Other (non HMO) | Source: Ambulatory Visit | Attending: Radiation Oncology | Admitting: Radiation Oncology

## 2018-12-25 ENCOUNTER — Ambulatory Visit: Payer: Managed Care, Other (non HMO) | Admitting: Radiation Oncology

## 2018-12-25 DIAGNOSIS — D0512 Intraductal carcinoma in situ of left breast: Secondary | ICD-10-CM | POA: Diagnosis not present

## 2018-12-28 ENCOUNTER — Ambulatory Visit: Payer: Managed Care, Other (non HMO)

## 2018-12-28 ENCOUNTER — Ambulatory Visit
Admission: RE | Admit: 2018-12-28 | Discharge: 2018-12-28 | Disposition: A | Discharge status: Home / Self Care | Payer: Managed Care, Other (non HMO) | Source: Ambulatory Visit | Attending: Radiation Oncology | Admitting: Radiation Oncology

## 2018-12-28 ENCOUNTER — Other Ambulatory Visit: Payer: Self-pay

## 2018-12-28 DIAGNOSIS — D0512 Intraductal carcinoma in situ of left breast: Secondary | ICD-10-CM | POA: Diagnosis not present

## 2018-12-29 ENCOUNTER — Other Ambulatory Visit: Payer: Self-pay

## 2018-12-29 ENCOUNTER — Ambulatory Visit: Payer: Managed Care, Other (non HMO)

## 2018-12-29 ENCOUNTER — Ambulatory Visit
Admission: RE | Admit: 2018-12-29 | Discharge: 2018-12-29 | Disposition: A | Discharge status: Home / Self Care | Payer: Managed Care, Other (non HMO) | Source: Ambulatory Visit | Attending: Radiation Oncology | Admitting: Radiation Oncology

## 2018-12-29 DIAGNOSIS — D0512 Intraductal carcinoma in situ of left breast: Secondary | ICD-10-CM | POA: Diagnosis not present

## 2018-12-30 ENCOUNTER — Other Ambulatory Visit: Payer: Self-pay

## 2018-12-30 ENCOUNTER — Ambulatory Visit: Payer: Managed Care, Other (non HMO)

## 2018-12-30 ENCOUNTER — Ambulatory Visit
Admission: RE | Admit: 2018-12-30 | Discharge: 2018-12-30 | Disposition: A | Discharge status: Home / Self Care | Payer: Managed Care, Other (non HMO) | Source: Ambulatory Visit | Attending: Radiation Oncology | Admitting: Radiation Oncology

## 2018-12-30 DIAGNOSIS — D0512 Intraductal carcinoma in situ of left breast: Secondary | ICD-10-CM | POA: Diagnosis not present

## 2018-12-31 ENCOUNTER — Ambulatory Visit: Payer: Managed Care, Other (non HMO)

## 2018-12-31 ENCOUNTER — Other Ambulatory Visit: Payer: Self-pay

## 2018-12-31 ENCOUNTER — Ambulatory Visit
Admission: RE | Admit: 2018-12-31 | Discharge: 2018-12-31 | Disposition: A | Discharge status: Home / Self Care | Payer: Managed Care, Other (non HMO) | Source: Ambulatory Visit | Attending: Radiation Oncology | Admitting: Radiation Oncology

## 2018-12-31 DIAGNOSIS — D0512 Intraductal carcinoma in situ of left breast: Secondary | ICD-10-CM | POA: Diagnosis not present

## 2019-01-01 ENCOUNTER — Other Ambulatory Visit: Payer: Self-pay

## 2019-01-01 ENCOUNTER — Ambulatory Visit
Admission: RE | Admit: 2019-01-01 | Discharge: 2019-01-01 | Disposition: A | Discharge status: Home / Self Care | Payer: Managed Care, Other (non HMO) | Source: Ambulatory Visit | Attending: Radiation Oncology | Admitting: Radiation Oncology

## 2019-01-01 ENCOUNTER — Ambulatory Visit: Payer: Managed Care, Other (non HMO)

## 2019-01-01 DIAGNOSIS — D0512 Intraductal carcinoma in situ of left breast: Secondary | ICD-10-CM | POA: Diagnosis not present

## 2019-01-04 ENCOUNTER — Ambulatory Visit
Admission: RE | Admit: 2019-01-04 | Discharge: 2019-01-04 | Disposition: A | Discharge status: Home / Self Care | Payer: Managed Care, Other (non HMO) | Source: Ambulatory Visit | Attending: Radiation Oncology | Admitting: Radiation Oncology

## 2019-01-04 ENCOUNTER — Other Ambulatory Visit: Payer: Self-pay

## 2019-01-04 ENCOUNTER — Encounter: Payer: Self-pay | Admitting: Radiation Oncology

## 2019-01-04 ENCOUNTER — Encounter: Payer: Self-pay | Admitting: *Deleted

## 2019-01-04 ENCOUNTER — Ambulatory Visit: Payer: Managed Care, Other (non HMO)

## 2019-01-04 DIAGNOSIS — D0512 Intraductal carcinoma in situ of left breast: Secondary | ICD-10-CM | POA: Diagnosis not present

## 2019-01-05 ENCOUNTER — Ambulatory Visit: Payer: Managed Care, Other (non HMO)

## 2019-01-06 ENCOUNTER — Ambulatory Visit: Payer: Managed Care, Other (non HMO)

## 2019-01-07 ENCOUNTER — Ambulatory Visit: Payer: Managed Care, Other (non HMO)

## 2019-01-12 ENCOUNTER — Telehealth: Payer: Self-pay

## 2019-01-12 NOTE — Telephone Encounter (Signed)
Patient called to let her know that paper work for her leave has been filled out and faxed back to the company

## 2019-02-03 ENCOUNTER — Telehealth: Payer: Self-pay | Admitting: Radiation Oncology

## 2019-02-03 NOTE — Telephone Encounter (Signed)
  Radiation Oncology         [phone] ________________________________  Name: Rhonda Rocha MRN: [id number]  Date of Service: 02/03/2019  DOB: 15-Nov-1970  Post Treatment Telephone Note  Diagnosis:  ER/PR negative high grade DCIS of the left breast.  Interval Since Last Radiation:  4 weeks   11/16/2018-01/04/2019: The patient received 50. 4 Gy to the left breast over 28 fractions as well as a 10 Gy boost in 5 fractions  Narrative:  The patient was contacted today for routine follow-up. During treatment she did very well with radiotherapy and did not have significant desquamation. She reports she is doing well and her skin is almost completely back to normal.  Impression/Plan: 1. ER/PR negative high grade DCIS of the left breast. The patient has been doing well since completion of radiotherapy. We discussed that we would be happy to continue to follow her as needed, but she will also continue to follow up with Dr. Lindi Adie in medical oncology. She was counseled on skin care as well as measures to avoid sun exposure to this area.  2. Survivorship. She was encouraged to keep her appointment in survivorship clinic as well.     Carola Rhine, PAC

## 2019-03-09 NOTE — Progress Notes (Signed)
  Radiation Oncology         [phone] ________________________________  Name: Rhonda Rocha MRN: [id number]  Date: 01/04/2019  DOB: [birth year]  End of Treatment Note  Diagnosis:   left-sided breast cancer     Indication for treatment:  Curative       Radiation treatment dates:   11/16/18 - 01/04/19  Site/dose:   The patient initially received a dose of 50.4 Gy in 28 fractions to the breast using whole-breast tangent fields. This was delivered using a 3-D conformal technique. The patient then received a boost to the seroma. This delivered an additional 10 Gy in 5 fractions using an en face electron boost. The total dose was 60.4 Gy.  Narrative: The patient tolerated radiation treatment relatively well.   The patient had some expected skin irritation as she progressed during treatment. Moist desquamation was not present at the end of treatment.  Plan: The patient has completed radiation treatment. The patient will return to radiation oncology clinic for routine followup in one month. I advised the patient to call or return sooner if they have any questions or concerns related to their recovery or treatment. ________________________________  Jodelle Gross, M.D., Ph.D.

## 2019-03-26 ENCOUNTER — Encounter: Payer: Managed Care, Other (non HMO) | Admitting: Adult Health

## 2019-03-26 NOTE — Progress Notes (Deleted)
SURVIVORSHIP VISIT:   BRIEF ONCOLOGIC HISTORY:  Oncology History  Ductal carcinoma in situ (DCIS) of left breast  09/09/2018 Initial Diagnosis   Ductal carcinoma in situ (DCIS) of left breast   09/24/2018 Genetic Testing   MET c.2609T>A and MSH2 c.508C>G VUS identified on the multi-cancer panel.  The Multi-Gene Panel offered by [facility] includes sequencing and/or deletion duplication testing of the following 85 genes: AIP, ALK, APC, ATM, AXIN2,BAP1,  BARD1, BLM, BMPR1A, BRCA1, BRCA2, BRIP1, CASR, CDC73, CDH1, CDK4, CDKN1B, CDKN1C, CDKN2A (p14ARF), CDKN2A (p16INK4a), CEBPA, CHEK2, CTNNA1, DICER1, DIS3L2, EGFR (c.2369C>T, p.Thr790Met variant only), EPCAM (Deletion/duplication testing only), FH, FLCN, GATA2, GPC3, GREM1 (Promoter region deletion/duplication testing only), HOXB13 (c.251G>A, p.Gly84Glu), HRAS, KIT, MAX, MEN1, MET, MITF (c.952G>A, p.Glu318Lys variant only), MLH1, MSH2, MSH3, MSH6, MUTYH, NBN, NF1, NF2, NTHL1, PALB2, PDGFRA, PHOX2B, PMS2, POLD1, POLE, POT1, PRKAR1A, PTCH1, PTEN, RAD50, RAD51C, RAD51D, RB1, RECQL4, RET, RNF43, RUNX1, SDHAF2, SDHA (sequence changes only), SDHB, SDHC, SDHD, SMAD4, SMARCA4, SMARCB1, SMARCE1, STK11, SUFU, TERC, TERT, TMEM127, TP53, TSC1, TSC2, VHL, WRN and WT1.  The report date is September 24, 2018.   10/16/2018 Surgery   Left lumpectomy [facility] Done) [phone]): high grade DCIS, 2.4cm, clear margins.  ER PR negative. No lymph nodes examined.   10/16/2018 Cancer Staging   Staging form: Breast, AJCC 8th Edition - Pathologic stage from 10/16/2018: Stage 0 (pTis (DCIS), pN0, cM0, ER-, PR-) - Signed by Gardenia Phlegm, NP on 03/01/2019   11/17/2018 - 01/04/2019 Radiation Therapy   The patient initially received a dose of 50.4 Gy in 28 fractions to the breast using whole-breast tangent fields. This was delivered using a 3-D conformal technique. The pt received a boost delivering an additional 10 Gy in 5 fractions using a electron boost with 56mV electrons. The  total dose was 60.4 Gy.     INTERVAL HISTORY:  Ms. CJoensto review her survivorship care plan detailing her treatment course for breast cancer, as well as monitoring long-term side effects of that treatment, education regarding health maintenance, screening, and overall wellness and health promotion.     Overall, Ms. CCarareports feeling quite well   REVIEW OF SYSTEMS:  Review of Systems - Oncology Breast: Denies any new nodularity, masses, tenderness, nipple changes, or nipple discharge.      ONCOLOGY TREATMENT TEAM:  1. Sur[facility]  [facility] 2. Medical Oncologist: Dr. *Marland Kitchen 3. Radiation Oncologist: Dr. *Marland Kitchen   PAST MEDICAL/SURGICAL HISTORY:  Past Medical History:  Diagnosis Date  [place]   . Family history of breast cancer   . Family history of prostate cancer   . Family history of stomach cancer    Past Surgical History:  Procedure Laterality Date  . BREAST BIOPSY Left 08/21/2018   Procedure: LEFT BREAST BIOPSY;  Surgeon: MJohnathan Hausen MD;  [facility]  Service: General;  Laterality: Left;  . CESAREAN SECTION WITH BILATERAL TUBAL LIGATION  2011  . MASTECTOMY, PARTIAL Left 10/16/2018   Procedure: LEFT BREAST PARTIAL MASTECTOMY;  Surgeon: MJohnathan Hausen MD;  [facility]  Service: General;  Laterality: Left;  . Uterine Ablation       ALLERGIES:  No Known Allergies   CURRENT MEDICATIONS:  Outpatient Encounter Medications as of 03/26/2019  Medication Sig  . acetaminophen (TYLENOL) 325 MG tablet Take 650 mg by mouth every 6 (six) hours as needed.  .Marland Kitchenibuprofen (ADVIL) 400 MG tablet Take 400 mg by mouth every 6 (six) hours as needed.  .Marland Kitchen  oxyCODONE (OXY IR/ROXICODONE) 5 MG immediate release tablet Take 1 tablet (5 mg total) by mouth every 6 (six) hours as needed for severe pain. (Patient not taking: Reported on 11/04/2018)   No facility-administered encounter medications on file as of  03/26/2019.     ONCOLOGIC FAMILY HISTORY:  Family History  Problem Relation Age of Onset  . Healthy Mother   . Other Father 15       "murdered"  . Breast cancer Maternal Aunt   . Prostate cancer Maternal Uncle   . Breast cancer Paternal Aunt 53  . Breast cancer Maternal Aunt 24  . Prostate cancer Maternal Uncle 66       d. 63  . Stomach cancer Cousin 15     GENETIC COUNSELING/TESTING: ***  SOCIAL HISTORY:  Social History   Socioeconomic History  . Marital status: Single    Spouse name: Not on file  . Number of children: Not on file  . Years of education: Not on file  . Highest education level: Not on file  Occupational History  . Not on file  Tobacco Use  . Smoking status: Never Smoker  . Smokeless tobacco: Never Used  Substance and Sexual Activity  . Alcohol use: No  . Drug use: Never  . Sexual activity: Not on file  Other Topics Concern  . Not on file  Social History Narrative  . Not on file   Social Determinants of Health   Financial Resource Strain:   . Difficulty of Paying Living Expenses: Not on file  Food Insecurity:   . Worried About Charity fundraiser in the Last Year: Not on file  . Ran Out of Food in the Last Year: Not on file  Transportation Needs: No Transportation Needs  . Lack of Transportation (Medical): No  . Lack of Transportation (Non-Medical): No  Physical Activity:   . Days of Exercise per Week: Not on file  . Minutes of Exercise per Session: Not on file  Stress:   . Feeling of Stress : Not on file  Social Connections:   . Frequency of Communication with Friends and Family: Not on file  . Frequency of Social Gatherings with Friends and Family: Not on file  . Attends Religious Services: Not on file  . Active Member of Clubs or Organizations: Not on file  . Attends Archivist Meetings: Not on file  . Marital Status: Not on file  Intimate Partner Violence:   . Fear of Current or Ex-Partner: Not on file  . Emotionally  Abused: Not on file  . Physically Abused: Not on file  . Sexually Abused: Not on file     OBSERVATIONS/OBJECTIVE:  There were no vitals taken for this visit. GENERAL: Patient is a well appearing female in no acute distress HEENT:  Sclerae anicteric.  Oropharynx clear and moist. No ulcerations or evidence of oropharyngeal candidiasis. Neck is supple.  NODES:  No cervical, supraclavicular, or axillary lymphadenopathy palpated.  BREAST EXAM:  Deferred. LUNGS:  Clear to auscultation bilaterally.  No wheezes or rhonchi. HEART:  Regular rate and rhythm. No murmur appreciated. ABDOMEN:  Soft, nontender.  Positive, normoactive bowel sounds. No organomegaly palpated. MSK:  No focal spinal tenderness to palpation. Full range of motion bilaterally in the upper extremities. EXTREMITIES:  No peripheral edema.   SKIN:  Clear with no obvious rashes or skin changes. No nail dyscrasia. NEURO:  Nonfocal. Well oriented.  Appropriate affect.    LABORATORY DATA:  None for this visit.  DIAGNOSTIC IMAGING:  None for this visit.      ASSESSMENT AND PLAN:  Ms.. Rhonda Rocha is a pleasant 49 y.o. female with Stage *** right/left breast invasive ductal carcinoma, ER+/PR+/HER2-, diagnosed in ***, treated with lumpectomy, adjuvant radiation therapy, and anti-estrogen therapy with *** beginning in ***.  She presents to the Survivorship Clinic for our initial meeting and routine follow-up post-completion of treatment for breast cancer.    1. Stage *** right/left breast cancer:  Ms. Shannon is continuing to recover from definitive treatment for breast cancer. She will follow-up with her medical oncologist, Dr. Ross Ludwig in *** with history and physical exam per surveillance protocol.  She will continue her anti-estrogen therapy with ***. Thus far, she is tolerating the *** well, with minimal side effects. She was instructed to make Dr. Lindi Adie or myself aware if she begins to experience any worsening side effects  of the medication and I could see her back in clinic to help manage those side effects, as needed. Her mammogram is due ***; orders placed today.  Her breast density is category ***. Today, a comprehensive survivorship care plan and treatment summary was reviewed with the patient today detailing her breast cancer diagnosis, treatment course, potential late/long-term effects of treatment, appropriate follow-up care with recommendations for the future, and patient education resources.  A copy of this summary, along with a letter will be sent to the patient's primary care provider via mail/fax/In Basket message after today's visit.    #. Problem(s) at Visit______________  #. Bone health:  Given Ms. Parson's age/history of breast cancer and her current treatment regimen including anti-estrogen therapy with ***, she is at risk for bone demineralization.  Her last DEXA scan was ***, which showed ***.  In the meantime, she was encouraged to increase her consumption of foods rich in calcium, as well as increase her weight-bearing activities.  She was given education on specific activities to promote bone health.  #. Cancer screening:  Due to Ms. Rosol's history and her age, she should receive screening for skin cancers, colon cancer, and gynecologic cancers.  The information and recommendations are listed on the patient's comprehensive care plan/treatment summary and were reviewed in detail with the patient.    #. Health maintenance and wellness promotion: Ms. Fesperman was encouraged to consume 5-7 servings of fruits and vegetables per day. We reviewed the "Nutrition Rainbow" handout, as well as the handout "Take Control of Your Health and Reduce Your Cancer Risk" from the [place].  She was also encouraged to engage in moderate to vigorous exercise for 30 minutes per day most days of the week. We discussed the [employer] fitness program, which is designed for cancer survivors to help them become  more physically fit after cancer treatments.  She was instructed to limit her alcohol consumption and continue to abstain from tobacco use/***was encouraged stop smoking.     #. Support services/counseling: It is not uncommon for this period of the patient's cancer care trajectory to be one of many emotions and stressors.  We discussed how this can be increasingly difficult during the times of quarantine and social distancing due to the COVID-19 pandemic.   She was given information regarding our available services and encouraged to contact me with any questions or for help enrolling in any of our support group/programs.    Follow up instructions:    -Return to cancer center ***  -Mammogram due in *** -Follow up with surgery *** -She is welcome to return  back to the Survivorship Clinic at any time; no additional follow-up needed at this time.  -Consider referral back to survivorship as a long-term survivor for continued surveillance  The patient was provided an opportunity to ask questions and all were answered. The patient agreed with the plan and demonstrated an understanding of the instructions.   Total encounter time: *** minutes  Wilber Bihari, NP 03/26/19 9:35 AM Medical Oncology and Hematology [facility] [place], [place] [address] Tel. [phone]    Fax. [phone]  *Total Encounter Time as defined by the Centers for Medicare and Medicaid Services includes, in addition to the face-to-face time of a patient visit (documented in the note above) non-face-to-face time: obtaining and reviewing outside history, ordering and reviewing medications, tests or procedures, care coordination (communications with other health care professionals or caregivers) and documentation in the medical record.

## 2019-05-25 ENCOUNTER — Other Ambulatory Visit: Payer: Self-pay

## 2019-05-25 ENCOUNTER — Emergency Department (HOSPITAL_COMMUNITY): Payer: Managed Care, Other (non HMO)

## 2019-05-25 ENCOUNTER — Emergency Department (HOSPITAL_COMMUNITY)
Admission: EM | Admit: 2019-05-25 | Discharge: 2019-05-25 | Disposition: A | Discharge status: Home / Self Care | Payer: Managed Care, Other (non HMO) | Attending: Emergency Medicine | Admitting: Emergency Medicine

## 2019-05-25 DIAGNOSIS — M5442 Lumbago with sciatica, left side: Secondary | ICD-10-CM | POA: Diagnosis not present

## 2019-05-25 DIAGNOSIS — M545 Low back pain: Secondary | ICD-10-CM | POA: Diagnosis present

## 2019-05-25 DIAGNOSIS — M5441 Lumbago with sciatica, right side: Secondary | ICD-10-CM | POA: Diagnosis not present

## 2019-05-25 DIAGNOSIS — Z853 Personal history of malignant neoplasm of breast: Secondary | ICD-10-CM | POA: Insufficient documentation

## 2019-05-25 DIAGNOSIS — Z79899 Other long term (current) drug therapy: Secondary | ICD-10-CM | POA: Diagnosis not present

## 2019-05-25 DIAGNOSIS — R52 Pain, unspecified: Secondary | ICD-10-CM

## 2019-05-25 MED ORDER — KETOROLAC TROMETHAMINE 30 MG/ML IJ SOLN
30.0000 mg | Freq: Once | INTRAMUSCULAR | Status: AC
  Administered 2019-05-25: 03:00:00 30 mg via INTRAMUSCULAR
  Filled 2019-05-25: qty 1

## 2019-05-25 MED ORDER — MELOXICAM 7.5 MG PO TABS: 7.5000 mg | ORAL_TABLET | Freq: Every day | ORAL | 0 refills | Status: DC

## 2019-05-25 MED ORDER — MELOXICAM 7.5 MG PO TABS: 7.5000 mg | ORAL_TABLET | Freq: Every day | ORAL | 0 refills | Status: AC

## 2019-05-25 MED ORDER — PREDNISONE 10 MG (21) PO TBPK: ORAL_TABLET | ORAL | 0 refills | Status: DC

## 2019-05-25 NOTE — ED Notes (Addendum)
Patient stated her visitor was outside waiting and I needed to go get him, screeners notified visitor is allowed at bedside.

## 2019-05-25 NOTE — ED Triage Notes (Signed)
Mid lower back pain for about a week, denies injury to the same.

## 2019-05-25 NOTE — Discharge Instructions (Addendum)
If you develop any changes to bowel or bladder function, decreased sensation in the upper inner thighs or underwear area, or have any other concerns please seek additional medical care and evaluation.  I have given you a prescription for Mobic (meloxicam) today.  Mobic is a NSAID medication and you should not take it with other NSAIDs.  Examples of other NSAIDS include motrin, ibuprofen, aleve, naproxen, and Voltaren.  Please monitor your bowel movements for dark, tarry, sticky stools. If you have any bowel movements like this you need to stop taking mobic and call your doctor as this may represent a stomach ulcer from taking NSAIDS.    Please take Tylenol (acetaminophen) to relieve your pain.  You may take tylenol, up to 1,000 mg (two extra strength pills).  Do not take more than 3,000 mg tylenol in a 24 hour period.  Please check all medication labels as many medications such as pain and cold medications may contain tylenol. Please do not drink alcohol while taking this medication.   You may also get lidocaine patches.  These are available at the drugstore.  You can leave them on for 12 hours and then have to take them off for full 12 hours before putting another patch on.  Do not put any ice or heat on your skin while you have a patch on as this can cause significant burns.  Please shower and fully clean off your skin before using heat or ice to reduce this risk.  I have given you a prescription for steroids today.  Some common side effects include feelings of extra energy, feeling warm, increased appetite, and stomach upset.  If you are diabetic your sugars may run higher than usual.

## 2019-05-25 NOTE — ED Notes (Signed)
Patient given warm blankets.

## 2019-05-25 NOTE — ED Notes (Signed)
Patient requesting to speak with a provider at this time. Will discharge afterwards.

## 2019-05-25 NOTE — ED Provider Notes (Signed)
[facility] DEPT Provider Note   CSN: [id number] Arrival date & time: 05/25/19  0005     History Chief Complaint  Patient presents with  . Back Pain    Rhonda Rocha is a 49 y.o. female with a past medical history of DCIS of left breast treated with partial mastectomy and radiation ending in December 2020, who presents today for evaluation of atraumatic lower back pain and hip pain.  She reports that her pain started about 1 week ago.  Her pain is unrelieved with Tylenol, hydrocodone, IcyHot, heat.  She reports that she has decreased sensation in her bilateral legs.  She denies any changes to bowel or bladder function.  She states that she has not done any activity that would cause a muscle spasm.  Her pain is constant, independent of her moving.  She states that pain limits her ability to walk.  No fevers.  No recent weight loss.  No pelvic pain.    HPI     Past Medical History:  Diagnosis Date  . Cancer ([place])   . Family history of breast cancer   . Family history of prostate cancer   . Family history of stomach cancer     Patient Active Problem List   Diagnosis Date Noted  . Genetic testing 09/25/2018  . Family history of breast cancer   . Family history of stomach cancer   . Family history of prostate cancer   . 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 Numbness and tingling 01/01/2013  . IRREGULAR MENSES 01/01/2010  . FOOT PAIN 03/24/2009  . PES PLANUS 03/16/2008  . HAIR LOSS 02/11/2008  . ACUTE CYSTITIS 01/04/2008  . DEPRESSION 10/22/2007  . MICROSCOPIC HEMATURIA 10/22/2007  . BREAST TENDERNESS 10/22/2007  . PERIPHERAL EDEMA 05/19/2007  . CANDIDIASIS, VAGINAL 03/02/2007  . VAGINAL DISCHARGE 03/02/2007  . LOSS OF APPETITE 08/30/2006    Past Surgical History:  Procedure Laterality Date  . BREAST BIOPSY Left 08/21/2018   Procedure: LEFT BREAST BIOPSY;  Surgeon: Johnathan Hausen, MD;  Location: [facility];  Service: General;  Laterality: Left;  . CESAREAN SECTION WITH BILATERAL TUBAL LIGATION  2011  . MASTECTOMY, PARTIAL Left 10/16/2018   Procedure: LEFT BREAST PARTIAL MASTECTOMY;  Surgeon: Johnathan Hausen, MD;  Location: [facility];  Service: General;  Laterality: Left;  . Uterine Ablation       OB History   No obstetric history on file.     Family History  Problem Relation Age of Onset  . Healthy Mother   . Other Father 63       "murdered"  . Breast cancer Maternal Aunt   . Prostate cancer Maternal Uncle   . Breast cancer Paternal Aunt 68  . Breast cancer Maternal Aunt 43  . Prostate cancer Maternal Uncle 66       d. 82  . Stomach cancer Cousin 35    Social History   Tobacco Use  . Smoking status: Never Smoker  . Smokeless tobacco: Never Used  Substance Use Topics  . Alcohol use: No  . Drug use: Never    Home Medications Prior to Admission medications   Medication Sig Start Date End Date Taking? Authorizing Provider  acetaminophen (TYLENOL) 325 MG tablet Take 650 mg by mouth every 6 (six) hours as needed.    [provider]  ibuprofen (ADVIL) 400 MG tablet Take 400 mg by mouth every 6 (six) hours as needed.  [provider]  meloxicam (MOBIC) 7.5 MG tablet Take 1 tablet (7.5 mg total) by mouth daily for 10 days. 05/25/19 06/04/19  Lorin Glass, PA-C  predniSONE (STERAPRED UNI-PAK 21 TAB) 10 MG (21) TBPK tablet Take 6 tabs by mouth daily  for 1 day, then 5 tabs for 1 day, then 4 tabs for 1 day, then 3 tabs for 1 day, 2 tabs for 1 day, then 1 tab by mouth daily for 1 day 05/25/19   Lorin Glass, PA-C    Allergies    Patient has no known allergies.  Review of Systems   Review of Systems  Constitutional: Negative for chills and fever.  Respiratory: Negative for chest tightness and shortness of breath.   Genitourinary: Negative for frequency.  Musculoskeletal: Positive for back pain. Negative for neck pain and neck  stiffness.  Skin: Negative for color change, rash and wound.  Psychiatric/Behavioral: Negative for confusion.  All other systems reviewed and are negative.   Physical Exam Updated Vital Signs BP 111/76 (BP Location: Left Arm)   Pulse 77   Temp 98.6 F (37 C) (Oral)   Resp 15   LMP 05/16/2019 Comment: waiver signed  SpO2 96%   Physical Exam Vitals and nursing note reviewed.  Constitutional:      General: She is not in acute distress.    Appearance: She is well-developed. She is not diaphoretic.  HENT:     Head: Normocephalic and atraumatic.  Eyes:     General: No scleral icterus.       Right eye: No discharge.        Left eye: No discharge.     Conjunctiva/sclera: Conjunctivae normal.  Cardiovascular:     Rate and Rhythm: Normal rate and regular rhythm.  Pulmonary:     Effort: Pulmonary effort is normal. No respiratory distress.     Breath sounds: No stridor.  Abdominal:     General: There is no distension.  Musculoskeletal:        General: No deformity.     Cervical back: Normal range of motion.     Comments: Mild TTP over bilateral buttocks.  Unable to recreate or exacerbate her lower back pain with direct midline palpation or palpation over paraspinal muscles.   Skin:    General: Skin is warm and dry.     Comments: No skin changes present over lower back.   Neurological:     Mental Status: She is alert.     Motor: No abnormal muscle tone.     Comments: Subjective decreased sensation to light touch to bilateral lateral legs and medial lower legs.  5/5 strength ankle dorsiflexion and plantar flexion.    Psychiatric:        Mood and Affect: Mood normal.        Behavior: Behavior normal.     ED Results / Procedures / Treatments   Labs (all labs ordered are listed, but only abnormal results are displayed) Labs Reviewed - No data to display  EKG None  Radiology DG Lumbar Spine Complete  Result Date: 05/25/2019 CLINICAL DATA:  Low back and bilateral hip pain,  history of breast cancer EXAM: LUMBAR SPINE - COMPLETE 4+ VIEW COMPARISON:  Radiograph 06/02/2010 FINDINGS: Five normally formed lumbar type vertebral bodies. No acute fracture vertebral body height loss. No suspicious osseous lesions. Relative preservation of the intervertebral disc heights without significant discogenic or facet degenerative change. Included portions of the bony pelvis are unremarkable. Soft tissues are free of  acute abnormality. Few phleboliths in the pelvis. IMPRESSION: No acute bony abnormality or significant degenerative change. Electronically Signed   By: Lovena Le M.D.   On: 05/25/2019 01:51   DG Pelvis 1-2 Views  Result Date: 05/25/2019 CLINICAL DATA:  Hip and low back pain EXAM: PELVIS - 1-2 VIEW COMPARISON:  None. FINDINGS: There is no evidence of pelvic fracture or diastasis. No pelvic bone lesions are seen. IMPRESSION: Negative. Electronically Signed   By: Ulyses Jarred M.D.   On: 05/25/2019 01:54    Procedures Procedures (including critical care time)  Medications Ordered in ED Medications  ketorolac (TORADOL) 30 MG/ML injection 30 mg (30 mg Intramuscular Given 05/25/19 0307)    ED Course  I have reviewed the triage vital signs and the nursing notes.  Pertinent labs & imaging results that were available during my care of the patient were reviewed by me and considered in my medical decision making (see chart for details).    MDM Rules/Calculators/A&P                     Patient is a 49 year old woman who presents today for evaluation of pain in her back radiating down her bilateral legs.  She does have a history of breast cancer.  X-rays of the lumbar back and pelvis were obtained without evidence of lesion, collapse, or other acute abnormalities. She denies any changes to bowel or bladder function.  No saddle anesthesia or paresthesia.  She does have tenderness over the piriformis muscles bilaterally.  She does not have any significant urinary symptoms or  anterior abdominal pain. Given that she reports Advil and other NSAIDs are not significantly relieving her pain will give prescription for meloxicam.  In addition she will be given a short course of prednisone to help with inflammation after we discussed risks of this. She is given a dose of IM Toradol while in the emergency room to help with her pain.  Patient requested a note for a standing desk which was given.  Recommended conservative care including close outpatient follow-up with her primary care doctor for consideration of PT.  At this time there are no red flag symptoms.  She does not have any true numbness, rather is pain with decreased sensation to light touch along the lateral legs down into the foot bilaterally.  Return precautions were discussed with patient who states their understanding.  At the time of discharge patient denied any unaddressed complaints or concerns.  Patient is agreeable for discharge home.  Note: Portions of this report may have been transcribed using voice recognition software. Every effort was made to ensure accuracy; however, inadvertent computerized transcription errors may be present  Final Clinical Impression(s) / ED Diagnoses Final diagnoses:  Acute bilateral low back pain with bilateral sciatica    Rx / DC Orders ED Discharge Orders         Ordered    meloxicam (MOBIC) 7.5 MG tablet  Daily,   Status:  Discontinued     05/25/19 0304    predniSONE (STERAPRED UNI-PAK 21 TAB) 10 MG (21) TBPK tablet  Status:  Discontinued     05/25/19 0304    meloxicam (MOBIC) 7.5 MG tablet  Daily     05/25/19 0305    predniSONE (STERAPRED UNI-PAK 21 TAB) 10 MG (21) TBPK tablet     05/25/19 0305           Lorin Glass, PA-C [id number]    Delora Fuel, MD 0000000 [phone]

## 2019-05-25 NOTE — ED Notes (Signed)
Patient assisted on and off bedpan.

## 2019-06-14 ENCOUNTER — Other Ambulatory Visit: Payer: Self-pay | Admitting: Family Medicine

## 2019-06-14 DIAGNOSIS — N63 Unspecified lump in unspecified breast: Secondary | ICD-10-CM

## 2019-07-02 ENCOUNTER — Other Ambulatory Visit: Payer: Self-pay | Admitting: Hematology and Oncology

## 2019-07-02 DIAGNOSIS — N63 Unspecified lump in unspecified breast: Secondary | ICD-10-CM

## 2019-07-05 ENCOUNTER — Other Ambulatory Visit: Payer: Self-pay

## 2019-07-05 ENCOUNTER — Ambulatory Visit
Admission: RE | Admit: 2019-07-05 | Discharge: 2019-07-05 | Disposition: A | Discharge status: Home / Self Care | Payer: Managed Care, Other (non HMO) | Source: Ambulatory Visit | Attending: Family Medicine | Admitting: Family Medicine

## 2019-07-05 DIAGNOSIS — N63 Unspecified lump in unspecified breast: Secondary | ICD-10-CM

## 2019-07-05 HISTORY — DX: Personal history of irradiation: Z92.3

## 2019-07-12 ENCOUNTER — Telehealth: Payer: Self-pay

## 2019-07-12 ENCOUNTER — Other Ambulatory Visit: Payer: Self-pay

## 2019-07-12 ENCOUNTER — Emergency Department (HOSPITAL_COMMUNITY)
Admission: EM | Admit: 2019-07-12 | Discharge: 2019-07-12 | Discharge status: Against Medical Advice | Payer: Managed Care, Other (non HMO)

## 2019-07-12 NOTE — Telephone Encounter (Signed)
Called to verify address. Mailed out survivorship information package to address listed.

## 2019-07-22 ENCOUNTER — Ambulatory Visit (INDEPENDENT_AMBULATORY_CARE_PROVIDER_SITE_OTHER): Payer: Managed Care, Other (non HMO)

## 2019-07-22 ENCOUNTER — Encounter: Payer: Self-pay | Admitting: Emergency Medicine

## 2019-07-22 ENCOUNTER — Other Ambulatory Visit: Payer: Self-pay

## 2019-07-22 ENCOUNTER — Ambulatory Visit
Admission: EM | Admit: 2019-07-22 | Discharge: 2019-07-22 | Disposition: A | Discharge status: Home / Self Care | Payer: Managed Care, Other (non HMO) | Attending: Physician Assistant | Admitting: Physician Assistant

## 2019-07-22 DIAGNOSIS — R05 Cough: Secondary | ICD-10-CM

## 2019-07-22 DIAGNOSIS — J209 Acute bronchitis, unspecified: Secondary | ICD-10-CM

## 2019-07-22 MED ORDER — DOXYCYCLINE HYCLATE 100 MG PO CAPS: 100.0000 mg | ORAL_CAPSULE | Freq: Two times a day (BID) | ORAL | 0 refills | Status: DC

## 2019-07-22 MED ORDER — HYDROCOD POLST-CPM POLST ER 10-8 MG/5ML PO SUER: 5.0000 mL | Freq: Two times a day (BID) | ORAL | 0 refills | Status: DC | PRN

## 2019-07-22 MED ORDER — PREDNISONE 50 MG PO TABS: 50.0000 mg | ORAL_TABLET | Freq: Every day | ORAL | 0 refills | Status: DC

## 2019-07-22 MED ORDER — ALBUTEROL SULFATE HFA 108 (90 BASE) MCG/ACT IN AERS: 1.0000 | INHALATION_SPRAY | Freq: Four times a day (QID) | RESPIRATORY_TRACT | 0 refills | Status: DC | PRN

## 2019-07-22 NOTE — ED Triage Notes (Signed)
Cough for a month, patient sounds very congested in chest.  Cough is worse at night.  Cough preventing her from sleeping.  Patient coughing up clear phlegm, no discolored phlegm.  Patient says intermittently wheezing.  otc medicines not helping cough.

## 2019-07-22 NOTE — ED Provider Notes (Signed)
[facility]    CSN: [id number] Arrival date & time: 07/22/19  1803      History   Chief Complaint Chief Complaint  Patient presents with   Cough    HPI Rhonda Rocha is a 49 y.o. female.   49 year old female with history of breast cancer s/p partial mastectomy with radiation in remission comes in for 1 month history of cough. Denies rhinorrhea, nasal congestion, sore throat. Shortness of breath during coughing fits. Still tolerating normal activity. Denies fever. Former smoker. No COVID testing since symptom onset. No COVID vaccine.      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   Numbness and tingling 01/01/2013   IRREGULAR MENSES 01/01/2010   FOOT PAIN 03/24/2009   PES PLANUS 03/16/2008   HAIR LOSS 02/11/2008   ACUTE CYSTITIS 01/04/2008   DEPRESSION 10/22/2007   MICROSCOPIC HEMATURIA 10/22/2007   BREAST TENDERNESS 10/22/2007   PERIPHERAL EDEMA 05/19/2007   CANDIDIASIS, VAGINAL 03/02/2007   VAGINAL DISCHARGE 03/02/2007   LOSS OF APPETITE 08/30/2006    Past Surgical History:  Procedure Laterality Date   BREAST BIOPSY Left 08/21/2018   Procedure: LEFT BREAST BIOPSY;  Surgeon: Johnathan Hausen, MD;  Location: [place];  Service: General;  Laterality: Left;   CESAREAN SECTION WITH BILATERAL TUBAL LIGATION  2011   MASTECTOMY, PARTIAL Left 10/16/2018   Procedure: LEFT BREAST PARTIAL MASTECTOMY;  Surgeon: Johnathan Hausen, MD;  Location: [place];  Service: General;  Laterality:  Left;   Uterine Ablation      OB History   No obstetric history on file.      Home Medications    Prior to Admission medications   Medication Sig Start Date End Date Taking? Authorizing Provider  NON FORMULARY otc medicine   Yes [provider]  acetaminophen (TYLENOL) 325 MG tablet Take 650 mg by mouth every 6 (six) hours as needed.    [provider]  albuterol (VENTOLIN HFA) 108 (90 Base) MCG/ACT inhaler Inhale 1-2 puffs into the lungs every 6 (six) hours as needed for wheezing or shortness of breath. 07/22/19   Tasia Catchings, Stayce Delancy V, PA-C  chlorpheniramine-HYDROcodone (TUSSIONEX PENNKINETIC ER) 10-8 MG/5ML SUER Take 5 mLs by mouth every 12 (twelve) hours as needed for cough. 07/22/19   Tasia Catchings, Haskell Rihn V, PA-C  doxycycline (VIBRAMYCIN) 100 MG capsule Take 1 capsule (100 mg total) by mouth 2 (two) times daily. 07/22/19   Tasia Catchings, Denaja Verhoeven V, PA-C  ibuprofen (ADVIL) 400 MG tablet Take 400 mg by mouth every 6 (six) hours as needed.    [provider]  predniSONE (DELTASONE) 50 MG tablet Take 1 tablet (50 mg total) by mouth daily with breakfast. 07/22/19   Ok Edwards, PA-C    Family History Family History  Problem Relation Age of Onset   Healthy Mother    Other Father 43       "murdered"   Breast cancer Maternal Aunt    Prostate cancer Maternal  Uncle    Breast cancer Paternal Aunt 19   Breast cancer Maternal Aunt 45   Prostate cancer Maternal Uncle 66       d. 60   Stomach cancer Cousin 41    Social History Social History   Tobacco Use   Smoking status: Never Smoker   Smokeless tobacco: Never Used  Substance Use Topics   Alcohol use: No   Drug use: Never     Allergies   Patient has no known allergies.   Review of Systems Review of Systems  Reason unable to perform ROS: See HPI as above.     Physical Exam Triage Vital Signs ED Triage Vitals  Enc Vitals Group     BP 07/22/19 1819 (!) 153/85     Pulse Rate 07/22/19 1819 95     Resp 07/22/19 1819 (!) 22       Temp 07/22/19 1819 99 F (37.2 C)     Temp Source 07/22/19 1819 Oral     SpO2 07/22/19 1819 96 %     Weight --      Height --      Head Circumference --      Peak Flow --      Pain Score 07/22/19 1815 4     Pain Loc --      Pain Edu? --      Excl. in [place]? --    No data found.  Updated Vital Signs BP (!) 153/85 (BP Location: Left Arm)    Pulse 95    Temp 99 F (37.2 C) (Oral)    Resp (!) 22 Comment: coughing   LMP 07/05/2019    SpO2 96%   Physical Exam Constitutional:      General: She is not in acute distress.    Appearance: Normal appearance. She is not ill-appearing, toxic-appearing or diaphoretic.  HENT:     Head: Normocephalic and atraumatic.     Mouth/Throat:     Mouth: Mucous membranes are moist.     Pharynx: Oropharynx is clear. Uvula midline.  Cardiovascular:     Rate and Rhythm: Normal rate and regular rhythm.     Heart sounds: Normal heart sounds. No murmur heard.  No friction rub. No gallop.   Pulmonary:     Effort: Pulmonary effort is normal. No accessory muscle usage, prolonged expiration, respiratory distress or retractions.     Comments: Coughing throughout exam. Lungs clear to auscultation without adventitious lung sounds. Musculoskeletal:     Cervical back: Normal range of motion and neck supple.  Skin:    General: Skin is warm and dry.  Neurological:     General: No focal deficit present.     Mental Status: She is alert and oriented to person, place, and time.      UC Treatments / Results  Labs (all labs ordered are listed, but only abnormal results are displayed) Labs Reviewed - No data to display  EKG   Radiology DG Chest 2 View  Result Date: 07/22/2019 CLINICAL DATA:  Cough x1 month. EXAM: CHEST - 2 VIEW COMPARISON:  11/09/2014 FINDINGS: The heart size and mediastinal contours are within normal limits. Both lungs are clear. The visualized skeletal structures are unremarkable. IMPRESSION: No active cardiopulmonary disease. Electronically  Signed   By: Constance Holster M.D.   On: 07/22/2019 18:50    Procedures Procedures (including critical care time)  Medications Ordered in UC Medications - No data to display  Initial Impression / Assessment and Plan /  UC Course  I have reviewed the triage vital signs and the nursing notes.  Pertinent labs & imaging results that were available during my care of the patient were reviewed by me and considered in my medical decision making (see chart for details).    CXR negative for active cardiopulmonary disease. Will treat for bronchitis with doxycycline, prednisone. Albuterol as needed. Other symptomatic treatment discussed. Expected course of healing discussed. Return precautions given.  Final Clinical Impressions(s) / UC Diagnoses   Final diagnoses:  Acute bronchitis, unspecified organism   ED Prescriptions    Medication Sig Dispense Auth. Provider   predniSONE (DELTASONE) 50 MG tablet Take 1 tablet (50 mg total) by mouth daily with breakfast. 5 tablet Lovene Maret V, PA-C   doxycycline (VIBRAMYCIN) 100 MG capsule Take 1 capsule (100 mg total) by mouth 2 (two) times daily. 14 capsule Baeleigh Devincent V, PA-C   chlorpheniramine-HYDROcodone (TUSSIONEX PENNKINETIC ER) 10-8 MG/5ML SUER Take 5 mLs by mouth every 12 (twelve) hours as needed for cough. 70 mL Zedekiah Hinderman V, PA-C   albuterol (VENTOLIN HFA) 108 (90 Base) MCG/ACT inhaler Inhale 1-2 puffs into the lungs every 6 (six) hours as needed for wheezing or shortness of breath. 8 g Ok Edwards, PA-C     I have reviewed the PDMP during this encounter.   Ok Edwards, PA-C 07/22/19 1905

## 2019-07-22 NOTE — Discharge Instructions (Signed)
Chest xray negative. Start doxycycline and prednisone as directed. Tussionex at night as needed for cough. Follow up with PCP if symptoms not improving. If sudden chest pain, worsening of shortness of breath, fever, go to the ED for further evaluation.

## 2020-03-12 ENCOUNTER — Other Ambulatory Visit: Payer: Self-pay

## 2020-03-12 ENCOUNTER — Ambulatory Visit
Admission: EM | Admit: 2020-03-12 | Discharge: 2020-03-12 | Disposition: A | Discharge status: Home / Self Care | Payer: Managed Care, Other (non HMO) | Attending: Emergency Medicine | Admitting: Emergency Medicine

## 2020-03-12 ENCOUNTER — Encounter: Payer: Self-pay | Admitting: Emergency Medicine

## 2020-03-12 DIAGNOSIS — N898 Other specified noninflammatory disorders of vagina: Secondary | ICD-10-CM | POA: Diagnosis not present

## 2020-03-12 DIAGNOSIS — Z113 Encounter for screening for infections with a predominantly sexual mode of transmission: Secondary | ICD-10-CM | POA: Diagnosis not present

## 2020-03-12 MED ORDER — FLUCONAZOLE 150 MG PO TABS: 150.0000 mg | ORAL_TABLET | Freq: Once | ORAL | 0 refills | Status: AC

## 2020-03-12 NOTE — ED Provider Notes (Signed)
[facility]    CSN: [id number] Arrival date & time: 03/12/20  0825      History   Chief Complaint Chief Complaint  Patient presents with  . Exposure to STD    HPI Rhonda Rocha is a 50 y.o. female presenting today for evaluation of discharge.  Reports that she has had a small amount of discharge, reports possible yeast infection.  Reports that she does have a new partner and recently had intercourse with a condom broke.  She reports feeling a sore near her perineum area that has had some discomfort especially with wiping, but feels that this area has healed.  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  Reports area was isolated sore.  Denies any urinary symptoms of dysuria increased frequency or urgency.  Has history of yeast and BV, symptoms more suggestive of yeast per patient.  HPI  Past Medical History:  Diagnosis Date  . Cancer ([facility])   . Family history of breast cancer   . Family history of prostate cancer   . Family history of stomach cancer   . Personal history of radiation therapy     Patient Active Problem List   Diagnosis Date Noted  . Genetic testing 09/25/2018  . Family history of breast cancer   . Family history of stomach cancer   . Family history of prostate cancer   . 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 Numbness and tingling 01/01/2013  . IRREGULAR MENSES 01/01/2010  . FOOT PAIN 03/24/2009  . PES PLANUS 03/16/2008  . HAIR LOSS 02/11/2008  . ACUTE CYSTITIS 01/04/2008  . DEPRESSION 10/22/2007  . MICROSCOPIC HEMATURIA 10/22/2007  . BREAST TENDERNESS 10/22/2007  . PERIPHERAL EDEMA 05/19/2007  . CANDIDIASIS, VAGINAL 03/02/2007  . VAGINAL DISCHARGE 03/02/2007  . LOSS OF APPETITE 08/30/2006    Past Surgical History:  Procedure Laterality Date  . BREAST BIOPSY Left 08/21/2018   Procedure: LEFT BREAST BIOPSY;  Surgeon: Johnathan Hausen, MD;  Location: [place];  Service: General;  Laterality: Left;  . CESAREAN SECTION WITH BILATERAL TUBAL LIGATION  2011  . MASTECTOMY, PARTIAL Left 10/16/2018   Procedure: LEFT BREAST PARTIAL MASTECTOMY;  Surgeon: Johnathan Hausen, MD;  Location: [place];  Service: General;  Laterality: Left;  . Uterine Ablation      OB History   No obstetric history on file.      Home Medications    Prior to Admission medications   Medication Sig Start Date End Date Taking? Authorizing Provider  fluconazole (DIFLUCAN) 150 MG tablet Take 1 tablet (150 mg total) by mouth once for 1 dose. 03/12/20 03/12/20 Yes Meryl Hubers C, PA-C  acetaminophen (TYLENOL) 325 MG tablet Take 650 mg by mouth every 6 (six) hours as needed.    [provider]  albuterol (VENTOLIN HFA) 108 (90 Base) MCG/ACT inhaler Inhale 1-2 puffs into the lungs every 6 (six) hours as needed for wheezing or shortness of breath. 07/22/19   Tasia Catchings, Amy V, PA-C  ibuprofen (ADVIL) 400 MG tablet Take 400 mg by mouth every 6 (six) hours as needed.    [provider]  NON FORMULARY otc medicine    [provider]    Family History Family History  Problem Relation Age of Onset  . Healthy Mother   . Other Father 34       "murdered"  . Breast cancer Maternal Aunt   .  Prostate cancer Maternal Uncle   . Breast cancer Paternal Aunt 45  . Breast cancer Maternal Aunt 71  . Prostate cancer Maternal Uncle 66       d. 45  . Stomach cancer Cousin 60    Social History Social History   Tobacco Use  . Smoking status: Never Smoker  . Smokeless tobacco: Never Used  Substance Use Topics  . Alcohol use: No  . Drug use: Never     Allergies   Patient has no known allergies.   Review of Systems Review of Systems  Constitutional: Negative for fever.  Respiratory: Negative for shortness of breath.   Cardiovascular: Negative for chest pain.  Gastrointestinal: Negative for abdominal pain, diarrhea, nausea and  vomiting.  Genitourinary: Positive for genital sores and vaginal discharge. Negative for dysuria, flank pain, hematuria, menstrual problem, vaginal bleeding and vaginal pain.  Musculoskeletal: Negative for back pain.  Skin: Negative for rash.  Neurological: Negative for dizziness, light-headedness and headaches.     Physical Exam Triage Vital Signs ED Triage Vitals  Enc Vitals Group     BP      Pulse      Resp      Temp      Temp src      SpO2      Weight      Height      Head Circumference      Peak Flow      Pain Score      Pain Loc      Pain Edu?      Excl. in [place]?    No data found.  Updated Vital Signs BP (!) 123/51 (BP Location: Left Arm)   Pulse 72   Temp 97.9 F (36.6 C) (Oral)   Resp 18   SpO2 98%   Visual Acuity Right Eye Distance:   Left Eye Distance:   Bilateral Distance:    Right Eye Near:   Left Eye Near:    Bilateral Near:     Physical Exam Vitals and nursing note reviewed.  Constitutional:      Appearance: She is well-developed and well-nourished.     Comments: No acute distress  HENT:     Head: Normocephalic and atraumatic.     Nose: Nose normal.  Eyes:     Conjunctiva/sclera: Conjunctivae normal.  Cardiovascular:     Rate and Rhythm: Normal rate.  Pulmonary:     Effort: Pulmonary effort is normal. No respiratory distress.  Abdominal:     General: There is no distension.  Genitourinary:    Comments: No obvious sore, skin opening/breakdown or lesions noted to genital area perineum or rectum, vaginal mucosa pink, moderate amount of yellowish-brownish discharge present, IUD strings present in cervical os Musculoskeletal:        General: Normal range of motion.     Cervical back: Neck supple.  Skin:    General: Skin is warm and dry.  Neurological:     Mental Status: She is alert and oriented to person, place, and time.  Psychiatric:        Mood and Affect: Mood and affect normal.      UC Treatments / Results  Labs (all labs  ordered are listed, but only abnormal results are displayed) Labs Reviewed  HIV ANTIBODY (ROUTINE TESTING W REFLEX)  RPR  CERVICOVAGINAL ANCILLARY ONLY    EKG   Radiology No results found.  Procedures Procedures (including critical care time)  Medications Ordered in UC Medications -  No data to display  Initial Impression / Assessment and Plan / UC Course  I have reviewed the triage vital signs and the nursing notes.  Pertinent labs & imaging results that were available during my care of the patient were reviewed by me and considered in my medical decision making (see chart for details).     Vaginal swab pending for STD screening as well as further confirmation of cause of discharge, empirically treating for yeast today with Diflucan, HIV and RPR pending.  Call with results and provide further treatment as needed.  No sores or lesions noted at this time requiring treatment.  Continue to monitor for resolution of area previously noted by patient.  Discussed strict return precautions. Patient verbalized understanding and is agreeable with plan.  Final Clinical Impressions(s) / UC Diagnoses   Final diagnoses:  Vaginal discharge  Screen for STD (sexually transmitted disease)     Discharge Instructions     Take 1 tab of Diflucan today to treat yeast, may repeat in 72 hours if still having symptoms and swab positive for yeast  We are testing you for HIV, Syphillis, Gonorrhea, Chlamydia, Trichomonas, Yeast and Bacterial Vaginosis. We will call you if anything is positive and let you know if you require any further treatment. Please inform partners of any positive results.   Please return if symptoms not improving with treatment, development of fever, nausea, vomiting, abdominal pain.     ED Prescriptions    Medication Sig Dispense Auth. Provider   fluconazole (DIFLUCAN) 150 MG tablet Take 1 tablet (150 mg total) by mouth once for 1 dose. 2 tablet Kaylene Dawn, [place] C, PA-C      PDMP not reviewed this encounter.   Janith Lima, [place] 03/12/20 [phone]

## 2020-03-12 NOTE — ED Triage Notes (Signed)
Pt here for vaginal discharge and concern for STD; pt sts possible blister to genital area also

## 2020-03-12 NOTE — Discharge Instructions (Signed)
Take 1 tab of Diflucan today to treat yeast, may repeat in 72 hours if still having symptoms and swab positive for yeast  We are testing you for HIV, Syphillis, Gonorrhea, Chlamydia, Trichomonas, Yeast and Bacterial Vaginosis. We will call you if anything is positive and let you know if you require any further treatment. Please inform partners of any positive results.   Please return if symptoms not improving with treatment, development of fever, nausea, vomiting, abdominal pain.

## 2020-03-13 LAB — CERVICOVAGINAL ANCILLARY ONLY
Bacterial Vaginitis (gardnerella): NEGATIVE
Candida Glabrata: POSITIVE — AB
Candida Vaginitis: NEGATIVE
Chlamydia: NEGATIVE
Comment: NEGATIVE
Comment: NEGATIVE
Comment: NEGATIVE
Comment: NEGATIVE
Comment: NEGATIVE
Comment: NORMAL
Neisseria Gonorrhea: NEGATIVE
Trichomonas: NEGATIVE

## 2020-03-14 LAB — HIV ANTIBODY (ROUTINE TESTING W REFLEX): HIV Screen 4th Generation wRfx: NONREACTIVE

## 2020-03-14 LAB — RPR: RPR Ser Ql: NONREACTIVE

## 2020-03-17 ENCOUNTER — Encounter: Payer: Self-pay | Admitting: Genetic Counselor

## 2020-06-21 ENCOUNTER — Ambulatory Visit
Admission: EM | Admit: 2020-06-21 | Discharge: 2020-06-21 | Disposition: A | Discharge status: Home / Self Care | Payer: Managed Care, Other (non HMO) | Attending: Emergency Medicine | Admitting: Emergency Medicine

## 2020-06-21 ENCOUNTER — Other Ambulatory Visit: Payer: Self-pay

## 2020-06-21 ENCOUNTER — Encounter: Payer: Self-pay | Admitting: Emergency Medicine

## 2020-06-21 DIAGNOSIS — N760 Acute vaginitis: Secondary | ICD-10-CM | POA: Diagnosis not present

## 2020-06-21 LAB — POCT URINALYSIS DIP (MANUAL ENTRY)
Bilirubin, UA: NEGATIVE
Glucose, UA: NEGATIVE mg/dL
Ketones, POC UA: NEGATIVE mg/dL
Leukocytes, UA: NEGATIVE
Nitrite, UA: NEGATIVE
Protein Ur, POC: NEGATIVE mg/dL
Spec Grav, UA: >=1.03 — AB (ref 1.010–1.025)
Urobilinogen, UA: 0.2 E.U./dL
pH, UA: 6 (ref 5.0–8.0)

## 2020-06-21 LAB — POCT URINE PREGNANCY: Preg Test, Ur: NEGATIVE

## 2020-06-21 MED ORDER — FLUCONAZOLE 150 MG PO TABS: 150.0000 mg | ORAL_TABLET | Freq: Once | ORAL | 0 refills | Status: AC

## 2020-06-21 MED ORDER — METRONIDAZOLE 500 MG PO TABS: 500.0000 mg | ORAL_TABLET | Freq: Two times a day (BID) | ORAL | 0 refills | Status: AC

## 2020-06-21 NOTE — ED Triage Notes (Signed)
Pt here for vaginal discharge and irritation x 3 days

## 2020-06-21 NOTE — Discharge Instructions (Addendum)
Urine not suggestive of UTI  1 tablet Diflucan today, repeat in 72 hours if still having symptoms and positive for yeast Flagyl twice daily for 1 week to treat bacterial vaginosis Vaginal swab pending-screening for yeast, BV, gonorrhea chlamydia trichomonas Drink plenty of fluids We will call with results and alter treatment as needed

## 2020-06-21 NOTE — ED Provider Notes (Signed)
[facility]    CSN: [id number] Arrival date & time: 06/21/20  1743      History   Chief Complaint Chief Complaint  Patient presents with  . Vaginal Discharge    HPI Rhonda Rocha is a 50 y.o. female presenting today for evaluation of vaginal discharge.  Reports discharge and irritation over the past 3 days.  Reports history of yeast and BV and symptoms feel slightly similar to both.  Also reports some lower abdominal pressure and would like to be screened for UTI.  Due for menstrual cycle around this time, but has not started.  Has had prior tubal ligation.  HPI  Past Medical History:  Diagnosis Date  . Cancer ([place])   . Family history of breast cancer   . Family history of prostate cancer   . Family history of stomach cancer   . Personal history of radiation therapy     Patient Active Problem List   Diagnosis Date Noted  . Genetic testing 09/25/2018  . Family history of breast cancer   . Family history of stomach cancer   . Family history of prostate cancer   . 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 Numbness and tingling 01/01/2013  . IRREGULAR MENSES 01/01/2010  . FOOT PAIN 03/24/2009  . PES PLANUS 03/16/2008  . HAIR LOSS 02/11/2008  . ACUTE CYSTITIS 01/04/2008  . DEPRESSION 10/22/2007  . MICROSCOPIC HEMATURIA 10/22/2007  . BREAST TENDERNESS 10/22/2007  . PERIPHERAL EDEMA 05/19/2007  . CANDIDIASIS, VAGINAL 03/02/2007  . VAGINAL DISCHARGE 03/02/2007  . LOSS OF APPETITE 08/30/2006    Past Surgical History:  Procedure Laterality Date  . BREAST BIOPSY Left 08/21/2018   Procedure: LEFT BREAST BIOPSY;  Surgeon: Johnathan Hausen, MD;  Location: [place];  Service: General;  Laterality: Left;  . CESAREAN SECTION WITH BILATERAL TUBAL LIGATION  2011  . MASTECTOMY, PARTIAL Left 10/16/2018   Procedure: LEFT BREAST PARTIAL MASTECTOMY;  Surgeon: Johnathan Hausen, MD;  Location: [place];  Service:  General;  Laterality: Left;  . Uterine Ablation      OB History   No obstetric history on file.      Home Medications    Prior to Admission medications   Medication Sig Start Date End Date Taking? Authorizing Provider  fluconazole (DIFLUCAN) 150 MG tablet Take 1 tablet (150 mg total) by mouth once for 1 dose. 06/21/20 06/21/20 Yes Cris Talavera C, PA-C  metroNIDAZOLE (FLAGYL) 500 MG tablet Take 1 tablet (500 mg total) by mouth 2 (two) times daily for 7 days. 06/21/20 06/28/20 Yes Nila Winker C, PA-C  acetaminophen (TYLENOL) 325 MG tablet Take 650 mg by mouth every 6 (six) hours as needed.    [provider]  albuterol (VENTOLIN HFA) 108 (90 Base) MCG/ACT inhaler Inhale 1-2 puffs into the lungs every 6 (six) hours as needed for wheezing or shortness of breath. 07/22/19   Tasia Catchings, Amy V, PA-C  ibuprofen (ADVIL) 400 MG tablet Take 400 mg by mouth every 6 (six) hours as needed.    [provider]  NON FORMULARY otc medicine    [provider]    Family History Family History  Problem Relation Age of Onset  . Healthy Mother   . Other Father 79       "murdered"  . Breast cancer Maternal Aunt   . Prostate cancer Maternal Uncle   . Breast cancer Paternal Aunt 43  . Breast cancer Maternal Aunt 43  .  Prostate cancer Maternal Uncle 66       d. 26  . Stomach cancer Cousin 70    Social History Social History   Tobacco Use  . Smoking status: Never Smoker  . Smokeless tobacco: Never Used  Substance Use Topics  . Alcohol use: No  . Drug use: Never     Allergies   Patient has no known allergies.   Review of Systems Review of Systems  Constitutional: Negative for fever.  Respiratory: Negative for shortness of breath.   Cardiovascular: Negative for chest pain.  Gastrointestinal: Negative for abdominal pain, diarrhea, nausea and vomiting.  Genitourinary: Positive for vaginal discharge. Negative for dysuria, flank pain, genital sores, hematuria, menstrual  problem, vaginal bleeding and vaginal pain.  Musculoskeletal: Negative for back pain.  Skin: Negative for rash.  Neurological: Negative for dizziness, light-headedness and headaches.     Physical Exam Triage Vital Signs ED Triage Vitals [06/21/20 1928]  Enc Vitals Group     BP 122/79     Pulse Rate 79     Resp 18     Temp 98 F (36.7 C)     Temp Source Oral     SpO2 98 %     Weight      Height      Head Circumference      Peak Flow      Pain Score 2     Pain Loc      Pain Edu?      Excl. in [place]?    No data found.  Updated Vital Signs BP 122/79 (BP Location: Left Arm)   Pulse 79   Temp 98 F (36.7 C) (Oral)   Resp 18   SpO2 98%   Visual Acuity Right Eye Distance:   Left Eye Distance:   Bilateral Distance:    Right Eye Near:   Left Eye Near:    Bilateral Near:     Physical Exam Vitals and nursing note reviewed.  Constitutional:      Appearance: She is well-developed.     Comments: No acute distress  HENT:     Head: Normocephalic and atraumatic.     Nose: Nose normal.  Eyes:     Conjunctiva/sclera: Conjunctivae normal.  Cardiovascular:     Rate and Rhythm: Normal rate.  Pulmonary:     Effort: Pulmonary effort is normal. No respiratory distress.  Abdominal:     General: There is no distension.  Musculoskeletal:        General: Normal range of motion.     Cervical back: Neck supple.  Skin:    General: Skin is warm and dry.  Neurological:     Mental Status: She is alert and oriented to person, place, and time.      UC Treatments / Results  Labs (all labs ordered are listed, but only abnormal results are displayed) Labs Reviewed  POCT URINALYSIS DIP (MANUAL ENTRY) - Abnormal; Notable for the following components:      Result Value   Spec Grav, UA >=1.030 (*)    Blood, UA moderate (*)    All other components within normal limits  POCT URINE PREGNANCY  CERVICOVAGINAL ANCILLARY ONLY    EKG   Radiology No results  found.  Procedures Procedures (including critical care time)  Medications Ordered in UC Medications - No data to display  Initial Impression / Assessment and Plan / UC Course  I have reviewed the triage vital signs and the nursing notes.  Pertinent labs &  imaging results that were available during my care of the patient were reviewed by me and considered in my medical decision making (see chart for details).     Vaginitis-pregnancy negative, UA not suggestive of UTI, [place] treating for yeast and BV with Diflucan and Flagyl per patient request.  Will alter therapy as needed based off vaginal swab results.  Discussed strict return precautions. Patient verbalized understanding and is agreeable with plan.  Final Clinical Impressions(s) / UC Diagnoses   Final diagnoses:  Vaginitis and vulvovaginitis     Discharge Instructions     Urine not suggestive of UTI  1 tablet Diflucan today, repeat in 72 hours if still having symptoms and positive for yeast Flagyl twice daily for 1 week to treat bacterial vaginosis Vaginal swab pending-screening for yeast, BV, gonorrhea chlamydia trichomonas Drink plenty of fluids We will call with results and alter treatment as needed    ED Prescriptions    Medication Sig Dispense Auth. Provider   fluconazole (DIFLUCAN) 150 MG tablet Take 1 tablet (150 mg total) by mouth once for 1 dose. 2 tablet Fariha Goto C, PA-C   metroNIDAZOLE (FLAGYL) 500 MG tablet Take 1 tablet (500 mg total) by mouth 2 (two) times daily for 7 days. 14 tablet Kayleanna Lorman, [place] C, PA-C     PDMP not reviewed this encounter.   Janith Lima, [place] 06/21/20 1958

## 2020-06-23 LAB — CERVICOVAGINAL ANCILLARY ONLY
Bacterial Vaginitis (gardnerella): NEGATIVE
Candida Glabrata: NEGATIVE
Candida Vaginitis: NEGATIVE
Chlamydia: NEGATIVE
Comment: NEGATIVE
Comment: NEGATIVE
Comment: NEGATIVE
Comment: NEGATIVE
Comment: NEGATIVE
Comment: NORMAL
Neisseria Gonorrhea: NEGATIVE
Trichomonas: NEGATIVE

## 2020-07-16 ENCOUNTER — Encounter: Payer: Self-pay | Admitting: Emergency Medicine

## 2020-07-16 ENCOUNTER — Ambulatory Visit
Admission: EM | Admit: 2020-07-16 | Discharge: 2020-07-16 | Disposition: A | Discharge status: Home / Self Care | Payer: Managed Care, Other (non HMO) | Attending: Emergency Medicine | Admitting: Emergency Medicine

## 2020-07-16 ENCOUNTER — Other Ambulatory Visit: Payer: Self-pay

## 2020-07-16 DIAGNOSIS — N898 Other specified noninflammatory disorders of vagina: Secondary | ICD-10-CM | POA: Insufficient documentation

## 2020-07-16 MED ORDER — FLUCONAZOLE 150 MG PO TABS: 150.0000 mg | ORAL_TABLET | Freq: Once | ORAL | 0 refills | Status: AC

## 2020-07-16 MED ORDER — CLOTRIMAZOLE 1 % EX CREA: TOPICAL_CREAM | CUTANEOUS | 0 refills | Status: AC

## 2020-07-16 MED ORDER — LIDOCAINE 2 % EX GEL: 1.0000 g | CUTANEOUS | 0 refills | Status: AC | PRN

## 2020-07-16 NOTE — ED Provider Notes (Signed)
[facility]    CSN: [id number] Arrival date & time: 07/16/20  0848      History   Chief Complaint Chief Complaint  Patient presents with   Vaginitis    HPI Rhonda Rocha is a 50 y.o. female presenting today for evaluation of vaginal irritation.  Reports last night she began to have vaginal irritation which has persisted and worsened into today.  Has noticed an area of bleeding and redness to her vulva.  Reports that she last had intercourse Friday evening and declines this being rougher than normal, same partner for the past 6 months.  Denies any immediate pain.  Yesterday was wearing spanks and reports that these were frequently riding up and she was having to pull these down often.  Later that evening is when she began to develop discomfort.  She has had also itching.   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   Numbness and tingling 01/01/2013   IRREGULAR MENSES 01/01/2010   FOOT PAIN 03/24/2009   PES PLANUS 03/16/2008   HAIR LOSS 02/11/2008   ACUTE CYSTITIS 01/04/2008   DEPRESSION 10/22/2007   MICROSCOPIC HEMATURIA 10/22/2007   BREAST TENDERNESS 10/22/2007   PERIPHERAL EDEMA 05/19/2007   CANDIDIASIS, VAGINAL 03/02/2007   VAGINAL DISCHARGE 03/02/2007   LOSS OF APPETITE 08/30/2006    Past Surgical History:  Procedure Laterality Date   BREAST BIOPSY Left 08/21/2018   Procedure: LEFT BREAST BIOPSY;  Surgeon: Johnathan Hausen, MD;  Location: [place];  Service: General;  Laterality: Left;   CESAREAN SECTION WITH  BILATERAL TUBAL LIGATION  2011   MASTECTOMY, PARTIAL Left 10/16/2018   Procedure: LEFT BREAST PARTIAL MASTECTOMY;  Surgeon: Johnathan Hausen, MD;  Location: [place];  Service: General;  Laterality: Left;   Uterine Ablation      OB History   No obstetric history on file.      Home Medications    Prior to Admission medications   Medication Sig Start Date End Date Taking? Authorizing Provider  clotrimazole (LOTRIMIN) 1 % cream Apply to affected area 2 times daily 07/16/20  Yes Niclas Markell C, PA-C  fluconazole (DIFLUCAN) 150 MG tablet Take 1 tablet (150 mg total) by mouth once for 1 dose. 07/16/20 07/16/20 Yes Murdis Flitton C, PA-C  Lidocaine 2 % GEL Apply 1 g topically every 4 (four) hours as needed. 07/16/20  Yes Zoriana Oats C, PA-C  acetaminophen (TYLENOL) 325 MG tablet Take 650 mg by mouth every 6 (six) hours as needed.    [provider]  albuterol (VENTOLIN HFA) 108 (90 Base) MCG/ACT inhaler Inhale 1-2 puffs into the lungs every 6 (six) hours as needed for wheezing or shortness of breath. 07/22/19   Tasia Catchings, Amy V, PA-C  ibuprofen (ADVIL) 400 MG tablet Take 400 mg by mouth every 6 (six) hours as needed.    [provider]  NON FORMULARY otc medicine    [provider]    Family History  Family History  Problem Relation Age of Onset   Healthy Mother    Other Father 38       "murdered"   Breast cancer Maternal Aunt    Prostate cancer Maternal Uncle    Breast cancer Paternal Aunt 75   Breast cancer Maternal Aunt 45   Prostate cancer Maternal Uncle 66       d. 50   Stomach cancer Cousin 52    Social History Social History   Tobacco Use   Smoking status: Never   Smokeless tobacco: Never  Substance Use Topics   Alcohol use: No   Drug use: Never     Allergies   Patient has no known allergies.   Review of Systems Review of Systems  Constitutional:  Negative for fever.  Respiratory:  Negative for shortness of breath.    Cardiovascular:  Negative for chest pain.  Gastrointestinal:  Negative for abdominal pain, diarrhea, nausea and vomiting.  Genitourinary:  Positive for vaginal bleeding and vaginal pain. Negative for dysuria, flank pain, genital sores, hematuria, menstrual problem, pelvic pain and vaginal discharge.  Musculoskeletal:  Negative for back pain.  Skin:  Negative for rash.  Neurological:  Negative for dizziness, light-headedness and headaches.    Physical Exam Triage Vital Signs ED Triage Vitals  Enc Vitals Group     BP 07/16/20 0955 (!) 144/91     Pulse Rate 07/16/20 0955 80     Resp 07/16/20 0955 18     Temp 07/16/20 0955 98.1 F (36.7 C)     Temp Source 07/16/20 0955 Oral     SpO2 07/16/20 0955 98 %     Weight --      Height --      Head Circumference --      Peak Flow --      Pain Score 07/16/20 0956 6     Pain Loc --      Pain Edu? --      Excl. in [place]? --    No data found.  Updated Vital Signs BP (!) 144/91 (BP Location: Left Arm)   Pulse 80   Temp 98.1 F (36.7 C) (Oral)   Resp 18   SpO2 98%   Visual Acuity Right Eye Distance:   Left Eye Distance:   Bilateral Distance:    Right Eye Near:   Left Eye Near:    Bilateral Near:     Physical Exam Vitals and nursing note reviewed.  Constitutional:      Appearance: She is well-developed.     Comments: No acute distress  HENT:     Head: Normocephalic and atraumatic.     Nose: Nose normal.  Eyes:     Conjunctiva/sclera: Conjunctivae normal.  Cardiovascular:     Rate and Rhythm: Normal rate.  Pulmonary:     Effort: Pulmonary effort is normal. No respiratory distress.  Abdominal:     General: There is no distension.  Genitourinary:    Comments: No rashes or lesions noted to mons, labia majora, slight bleeding and irritation noted bilaterally to inner labia minora extending towards inferior portion of the vaginal orifice just anterior to perineum, no obvious ulcers or sores noted  Vagina with mild amount of  whitish-yellowish thicker discharge Musculoskeletal:        General: Normal range of motion.     Cervical back: Neck supple.  Skin:    General: Skin is warm and dry.  Neurological:     Mental Status: She is alert  and oriented to person, place, and time.     UC Treatments / Results  Labs (all labs ordered are listed, but only abnormal results are displayed) Labs Reviewed  HSV CULTURE AND TYPING  CERVICOVAGINAL ANCILLARY ONLY    EKG   Radiology No results found.  Procedures Procedures (including critical care time)  Medications Ordered in UC Medications - No data to display  Initial Impression / Assessment and Plan / UC Course  I have reviewed the triage vital signs and the nursing notes.  Pertinent labs & imaging results that were available during my care of the patient were reviewed by me and considered in my medical decision making (see chart for details).     Vaginal irritation-does not appear highly suggestive of HSV, but screened given associated pain with this, appears to be more irritation from possible trauma from to the area, does have some thicker discharge, opting to empirically treat for yeast with Diflucan and topical clotrimazole, lidocaine topically to help with pain, HSV swab and vaginal swab pending for further screening.  Monitor for gradual healing of area.  Avoid further irritation or moisture on area  Discussed strict return precautions. Patient verbalized understanding and is agreeable with plan.  Final Clinical Impressions(s) / UC Diagnoses   Final diagnoses:  Vaginal irritation     Discharge Instructions      1 tab of Diflucan today, repeat in 72 hours Apply lidocaine gel to area 4-6 times as needed daily Clotrimazole twice daily Avoid further irritation to the area and avoid tight clothing Let area air out Follow-up if not improving or worsening     ED Prescriptions     Medication Sig Dispense Auth. Provider   fluconazole  (DIFLUCAN) 150 MG tablet Take 1 tablet (150 mg total) by mouth once for 1 dose. 2 tablet Scott Fix C, PA-C   Lidocaine 2 % GEL Apply 1 g topically every 4 (four) hours as needed. 28.33 g Nneka Blanda C, PA-C   clotrimazole (LOTRIMIN) 1 % cream Apply to affected area 2 times daily 15 g Coltyn Hanning, [place] C, PA-C      PDMP not reviewed this encounter.   Janith Lima, [place] 07/16/20 [phone]

## 2020-07-16 NOTE — Discharge Instructions (Addendum)
1 tab of Diflucan today, repeat in 72 hours Apply lidocaine gel to area 4-6 times as needed daily Clotrimazole twice daily Avoid further irritation to the area and avoid tight clothing Let area air out Follow-up if not improving or worsening

## 2020-07-16 NOTE — ED Triage Notes (Signed)
Pt sts vaginal irritation and redness after having intercourse and wearing tight clothes yesterday

## 2020-07-17 LAB — CERVICOVAGINAL ANCILLARY ONLY
Bacterial Vaginitis (gardnerella): NEGATIVE
Candida Glabrata: NEGATIVE
Candida Vaginitis: NEGATIVE
Chlamydia: NEGATIVE
Comment: NEGATIVE
Comment: NEGATIVE
Comment: NEGATIVE
Comment: NEGATIVE
Comment: NEGATIVE
Comment: NORMAL
Neisseria Gonorrhea: NEGATIVE
Trichomonas: NEGATIVE

## 2020-07-19 LAB — HSV CULTURE AND TYPING

## 2020-08-16 ENCOUNTER — Other Ambulatory Visit: Payer: Self-pay | Admitting: Hematology and Oncology

## 2020-08-16 DIAGNOSIS — Z09 Encounter for follow-up examination after completed treatment for conditions other than malignant neoplasm: Secondary | ICD-10-CM

## 2020-08-30 ENCOUNTER — Other Ambulatory Visit: Payer: Self-pay | Admitting: Hematology and Oncology

## 2020-08-30 DIAGNOSIS — N63 Unspecified lump in unspecified breast: Secondary | ICD-10-CM

## 2020-09-04 ENCOUNTER — Other Ambulatory Visit: Payer: Self-pay

## 2020-09-04 ENCOUNTER — Ambulatory Visit
Admission: RE | Admit: 2020-09-04 | Discharge: 2020-09-04 | Disposition: A | Discharge status: Home / Self Care | Payer: Managed Care, Other (non HMO) | Source: Ambulatory Visit | Attending: Hematology and Oncology | Admitting: Hematology and Oncology

## 2020-09-04 DIAGNOSIS — N63 Unspecified lump in unspecified breast: Secondary | ICD-10-CM

## 2020-12-01 IMAGING — MR MR BILATERAL BREAST WITHOUT AND WITH CONTRAST
7 of 12 series · 25 of 48 positions shown · IV contrast (gadavist)
Comparison: Previous exam(s).
COMPARISON: Previous exam(s).

Addendum:
CLINICAL DATA: 47-year-old with LEFT nipple pain and skin changes
which patient describes as RIGHT all area under nipple with
bleeding, nipple crusting and skin sloughing.
TECHNIQUE: Multiplanar, multisequence MR images of both breasts were obtained
prior to and following the intravenous administration of 8 ml of
Gadavist

[Series 2: t2_tirm_tra ipat (a-p) · axial · 3.0mm · 0.70mm/px · 1 of 55 slices shown]
[im 1/55]
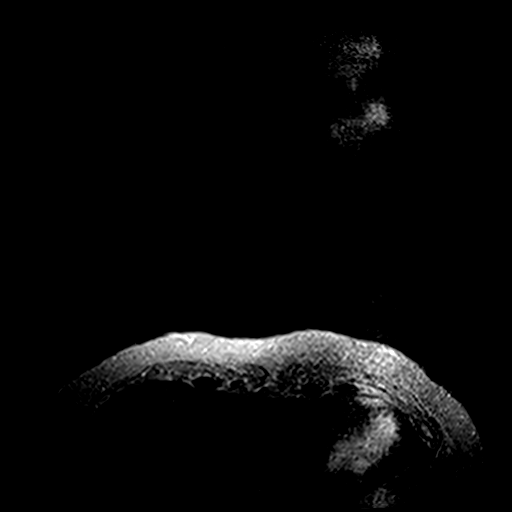

[Series 3: fl3d pre-cm no · axial · non-contrast · 1.2mm · 0.94mm/px · z∈[-65,+107]mm · 5 of 144 slices shown]
[im 1/144]
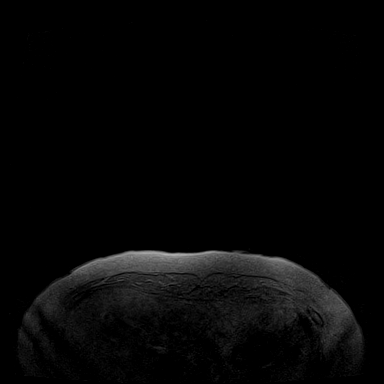
[im 36/144]
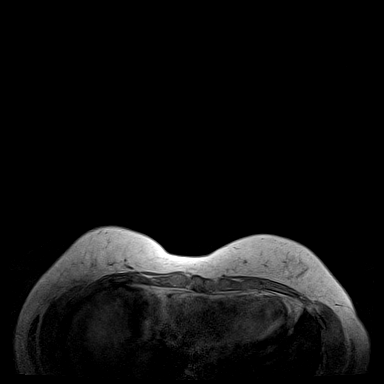
[im 72/144]
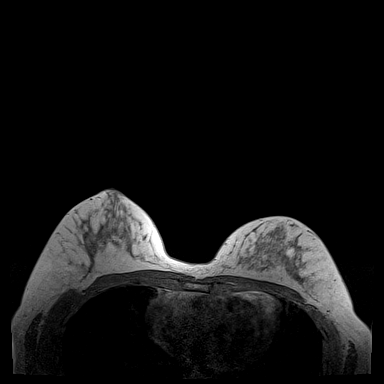
[im 108/144]
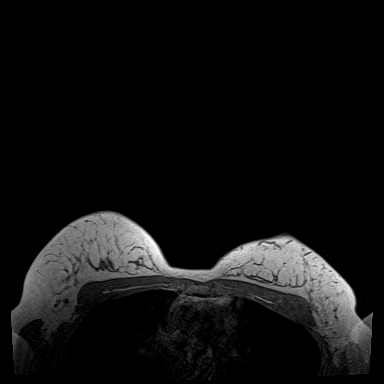
[im 144/144]
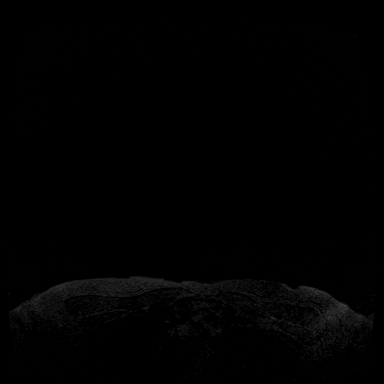

[Series 4: fl3d pre-cm · axial · non-contrast · 1.2mm · 0.94mm/px · z∈[-65,+107]mm · 5 of 144 slices shown]
[im 1/144]
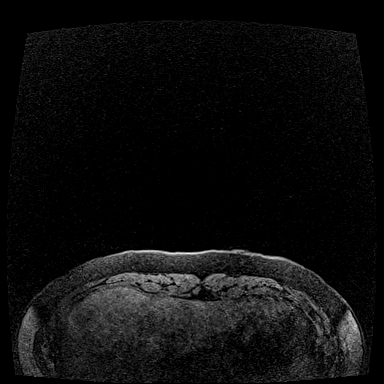
[im 36/144]
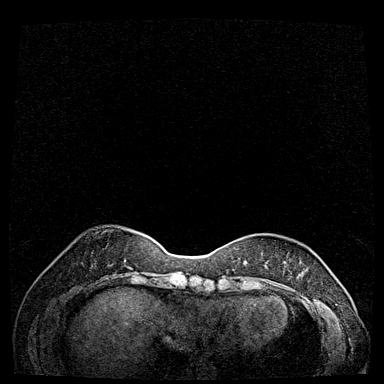
[im 72/144]
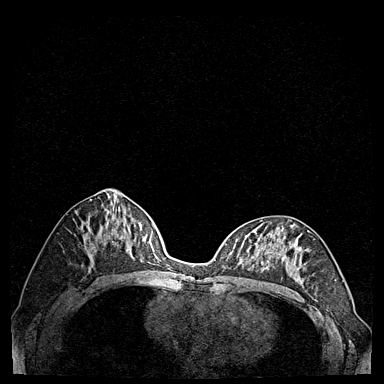
[im 108/144]
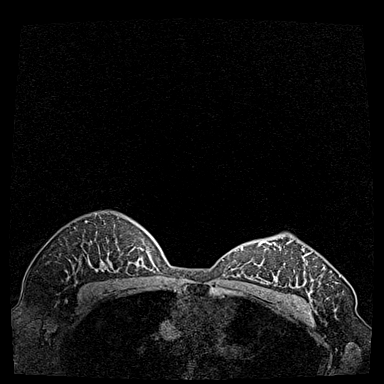
[im 144/144]
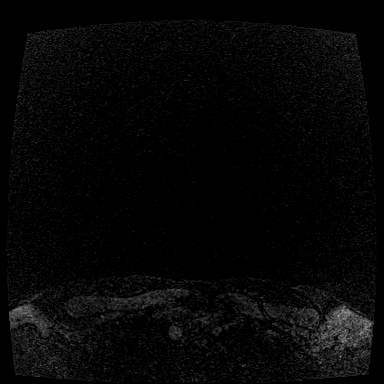

[Series 5: fl3d post-cm 20 · axial · 1.2mm · 0.94mm/px · z∈[-65,+107]mm · 5 of 144 slices shown (1 of 3)]
[im 1/144]
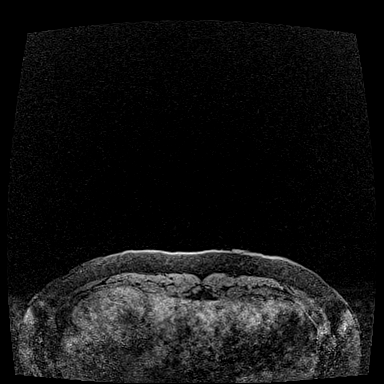
[im 36/144]
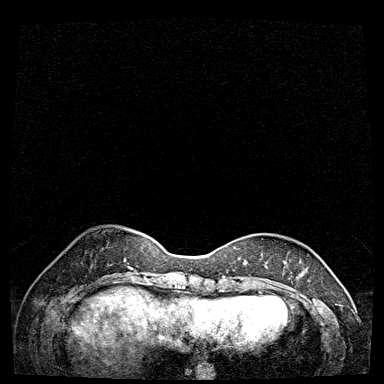
[im 72/144]
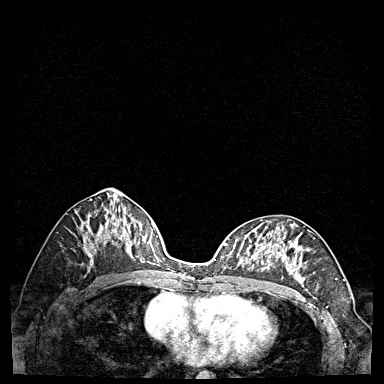
[im 108/144]
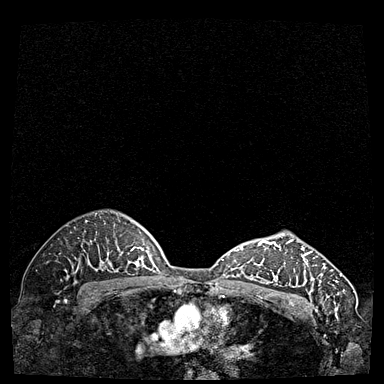
[im 144/144]
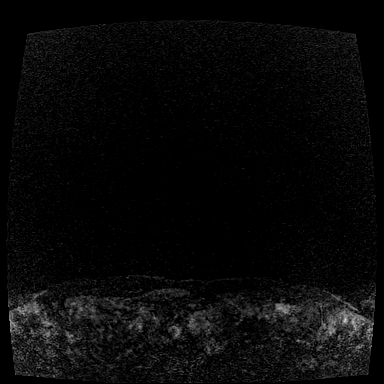

[Series 6: fl3d post-cm 20 · axial · 1.2mm · 0.94mm/px · z∈[-65,+107]mm · 5 of 144 slices shown (2 of 3)]
[im 1/144]
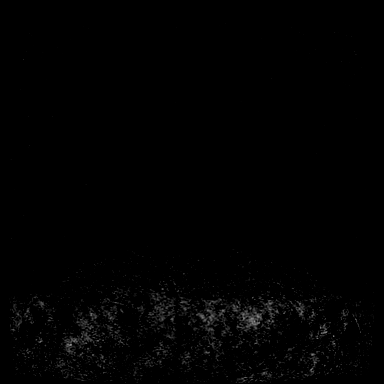
[im 36/144]
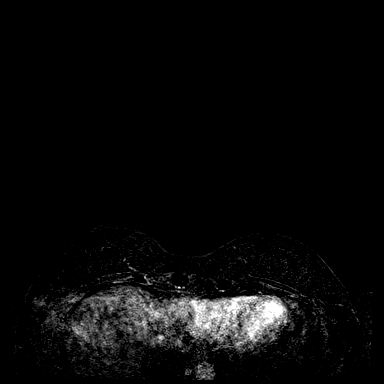
[im 72/144]
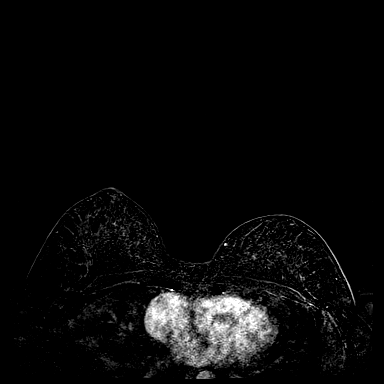
[im 108/144]
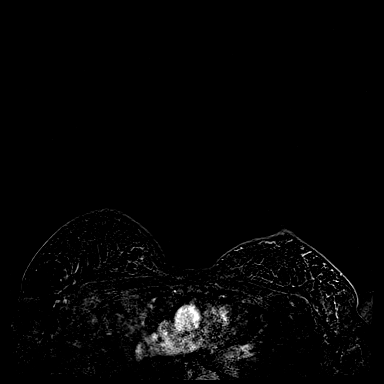
[im 144/144]
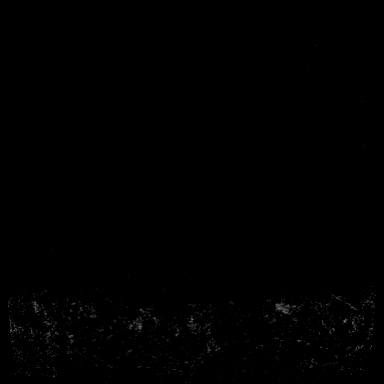

[Series 7: fl3d post-cm 20 · axial · 172.8mm · 0.94mm/px · 1 of 1 slices shown (3 of 3)]
[im 1/1]
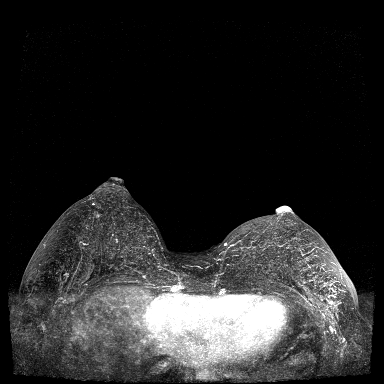

[Series 8: fl3d post-cm 3min · axial · 1.2mm · 0.94mm/px · z∈[-65,+4]mm · 3 of 144 slices shown]
[im 1/144]
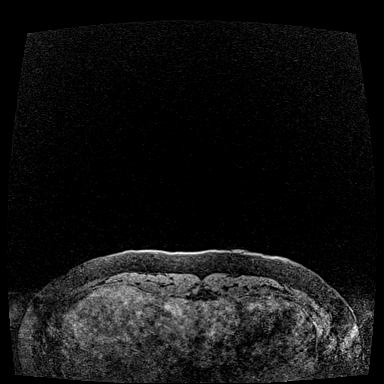
[im 29/144]
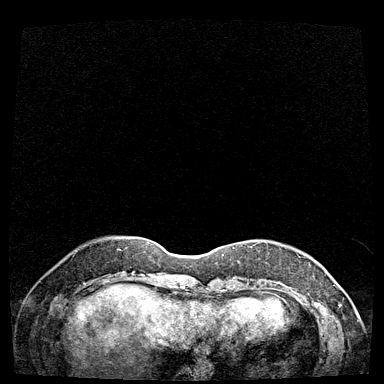
[im 58/144]
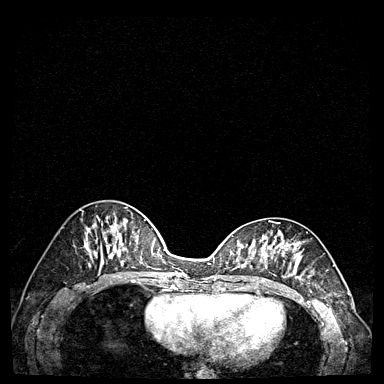

[25 of 48 positions shown; findings below may reference images not displayed]

Diagnostic mammogram and ultrasound was performed on 05/01/2018
revealing no mammographic or sonographic evidence of malignancy
within the LEFT breast. Physical exam of the LEFT nipple identified
small focus over all skin under the nipple at the 8 o'clock location
and expression of minimal clear discharged emanating from at least 3
duct orifices.

LABS:  No applicable

EXAM:
BILATERAL BREAST MRI WITH AND WITHOUT CONTRAST
Three-dimensional MR images were rendered by post-processing of the
original MR data on an independent workstation. The
three-dimensional MR images were interpreted, and findings are
reported in the following complete MRI report for this study. Three
dimensional images were evaluated at the independent [employer]
workstation
FINDINGS: Breast composition: d. Extreme fibroglandular tissue.

Background parenchymal enhancement: Mild

Right breast: No suspicious enhancing mass, non-mass enhancement or
secondary signs of malignancy.

Left breast: Abnormal asymmetric enhancement of the LEFT nipple. No
suspicious enhancing mass, non-mass enhancement or secondary signs
of malignancy within the underlying subareolar LEFT breast.

Oval circumscribed enhancing mass within the lower inner quadrant of
the RIGHT breast, just below the skin, measuring 5 mm, with mixed
enhancement kinetics including suspicious washout (axial series 6,
image 71), too small to definitively characterize but suspected to
be an intramammary lymph node.

Lymph nodes: No abnormal appearing lymph nodes.

Ancillary findings:  None.
IMPRESSION: 1. Abnormal asymmetric enhancement of the LEFT nipple. Recommend
surgical consultation for possible punch biopsy.
2. Oval circumscribed enhancing mass within the lower inner quadrant
of the LEFT breast, located just below the skin, measuring 5 mm,
suspected intramammary lymph node. No correlate seen on patient's
recent diagnostic mammogram. Recommend targeted second-look
ultrasound. If a benign appearing lymph node is identified in this
area, no further workup is necessary.
3. No evidence of malignancy elsewhere within the LEFT breast.
4. No evidence of malignancy within the RIGHT breast.

RECOMMENDATION:
1. Surgical consultation for possible punch biopsy at the LEFT
nipple.
2. Targeted second-look ultrasound of the LEFT breast. If a benign
appearing lymph node is identified within the lower inner quadrant
of the LEFT breast, just below the skin, measuring approximately 5
mm, no additional workup is necessary. If no correlate is found by
ultrasound, and clinical and/or punch biopsy results for the LEFT
nipple are deemed benign, would then recommend follow-up breast MRI
in 6 months to ensure stability.

BI-RADS CATEGORY  4: Suspicious. Suspicious findings at the LEFT
nipple. Probably benign mass within the inner LEFT breast.

ADDENDUM:
I have now been informed that patient has had a LEFT nipple biopsy
revealing Paget's disease of the epidermidis at the LEFT nipple (9
o'clock axis) and DCIS with associated Paget's disease of the
epidermidis at the nipple (3 o'clock axis).

The associated enhancement on today's MRI is confined to the nipple,
including the inferomedial aspect of the nipple base, corresponding
to the pathology and presumably accentuated by recent biopsy
changes.

RECOMMENDATION: As in the original report, targeted second-look
ultrasound is recommended for the probably benign mass in the inner
LEFT breast, just below the skin, measuring 5 mm. If a benign
appearing lymph node is identified at this site, no additional
workup is necessary. If no sonographic correlate is identified, as
this mass is not amenable to MRI-guided biopsy due to its
superficial location, would recommend MRI-guided clip placement for
surgical excision.

No evidence of additional multifocal or multicentric disease within
the LEFT breast. No evidence of contralateral disease in the RIGHT
breast

BI-RADS CATEGORY: 3: Probably benign.

*** End of Addendum ***
Diagnostic mammogram and ultrasound was performed on 05/01/2018
revealing no mammographic or sonographic evidence of malignancy
within the LEFT breast. Physical exam of the LEFT nipple identified
small focus over all skin under the nipple at the 8 o'clock location
and expression of minimal clear discharged emanating from at least 3
duct orifices.

LABS:  No applicable

EXAM:
BILATERAL BREAST MRI WITH AND WITHOUT CONTRAST
Three-dimensional MR images were rendered by post-processing of the
original MR data on an independent workstation. The
three-dimensional MR images were interpreted, and findings are
reported in the following complete MRI report for this study. Three
dimensional images were evaluated at the independent [employer]
workstation
FINDINGS: Breast composition: d. Extreme fibroglandular tissue.

Background parenchymal enhancement: Mild

Right breast: No suspicious enhancing mass, non-mass enhancement or
secondary signs of malignancy.

Left breast: Abnormal asymmetric enhancement of the LEFT nipple. No
suspicious enhancing mass, non-mass enhancement or secondary signs
of malignancy within the underlying subareolar LEFT breast.

Oval circumscribed enhancing mass within the lower inner quadrant of
the RIGHT breast, just below the skin, measuring 5 mm, with mixed
enhancement kinetics including suspicious washout (axial series 6,
image 71), too small to definitively characterize but suspected to
be an intramammary lymph node.

Lymph nodes: No abnormal appearing lymph nodes.

Ancillary findings:  None.
IMPRESSION: 1. Abnormal asymmetric enhancement of the LEFT nipple. Recommend
surgical consultation for possible punch biopsy.
2. Oval circumscribed enhancing mass within the lower inner quadrant
of the LEFT breast, located just below the skin, measuring 5 mm,
suspected intramammary lymph node. No correlate seen on patient's
recent diagnostic mammogram. Recommend targeted second-look
ultrasound. If a benign appearing lymph node is identified in this
area, no further workup is necessary.
3. No evidence of malignancy elsewhere within the LEFT breast.
4. No evidence of malignancy within the RIGHT breast.

RECOMMENDATION:
1. Surgical consultation for possible punch biopsy at the LEFT
nipple.
2. Targeted second-look ultrasound of the LEFT breast. If a benign
appearing lymph node is identified within the lower inner quadrant
of the LEFT breast, just below the skin, measuring approximately 5
mm, no additional workup is necessary. If no correlate is found by
ultrasound, and clinical and/or punch biopsy results for the LEFT
nipple are deemed benign, would then recommend follow-up breast MRI
in 6 months to ensure stability.

BI-RADS CATEGORY  4: Suspicious. Suspicious findings at the LEFT
nipple. Probably benign mass within the inner LEFT breast.

## 2020-12-09 IMAGING — US ULTRASOUND LEFT BREAST LIMITED
1 series · 7 of 7 positions shown · non-contrast
Comparison: Previous exam(s).

CLINICAL DATA: 47-year-old with recent diagnosis of Paget's disease
of the LEFT nipple. Pretreatment MRI demonstrated a likely benign 5
mm mass in the LOWER INNER QUADRANT of the LEFT breast for which
second-look ultrasound is requested.

EXAM:
ULTRASOUND OF THE LEFT BREAST

[Series 1: ultrasound left breast limited · 0.04mm/px · 7 of 7 slices shown]
[im 1/7]
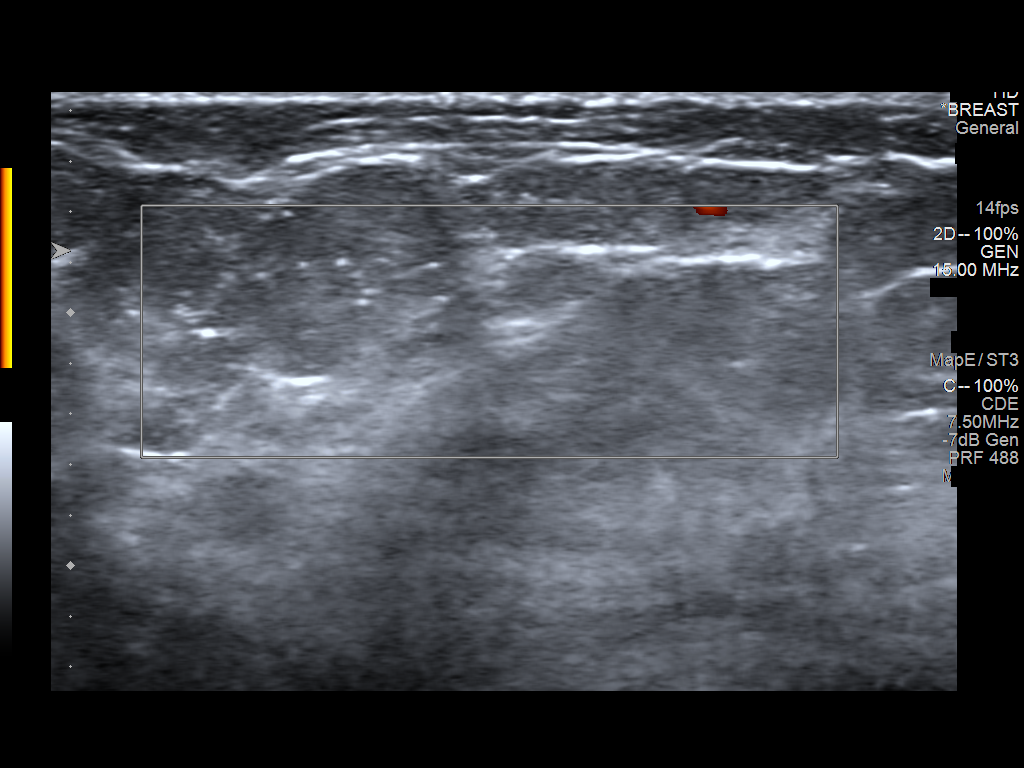
[im 2/7]
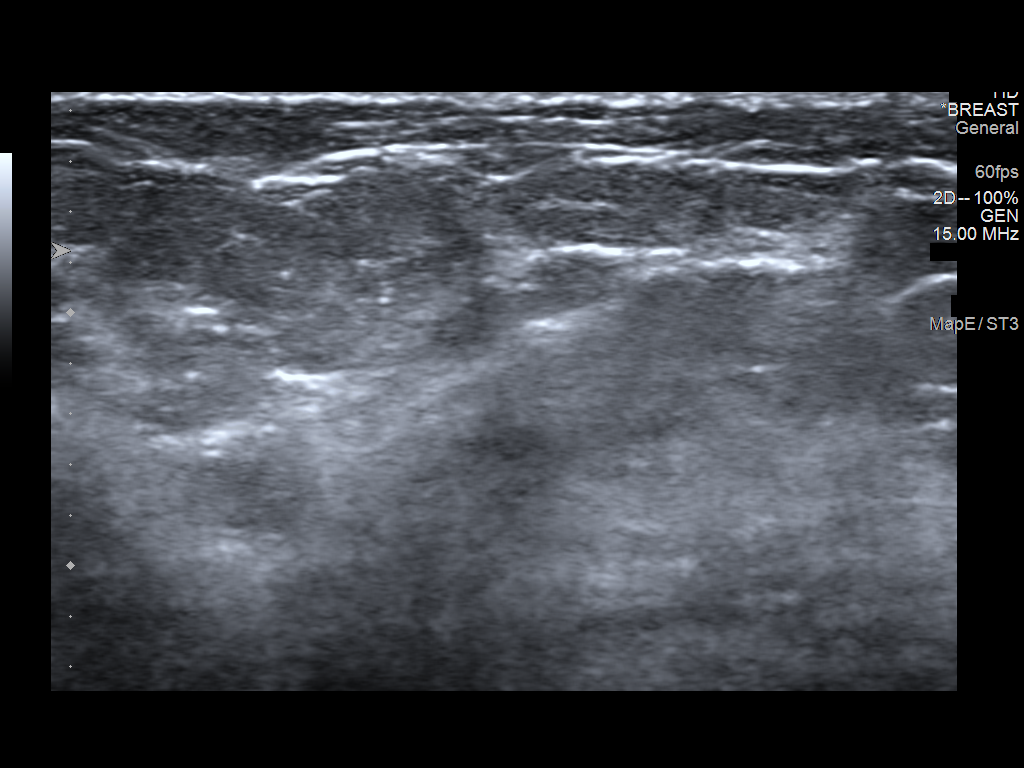
[im 3/7]
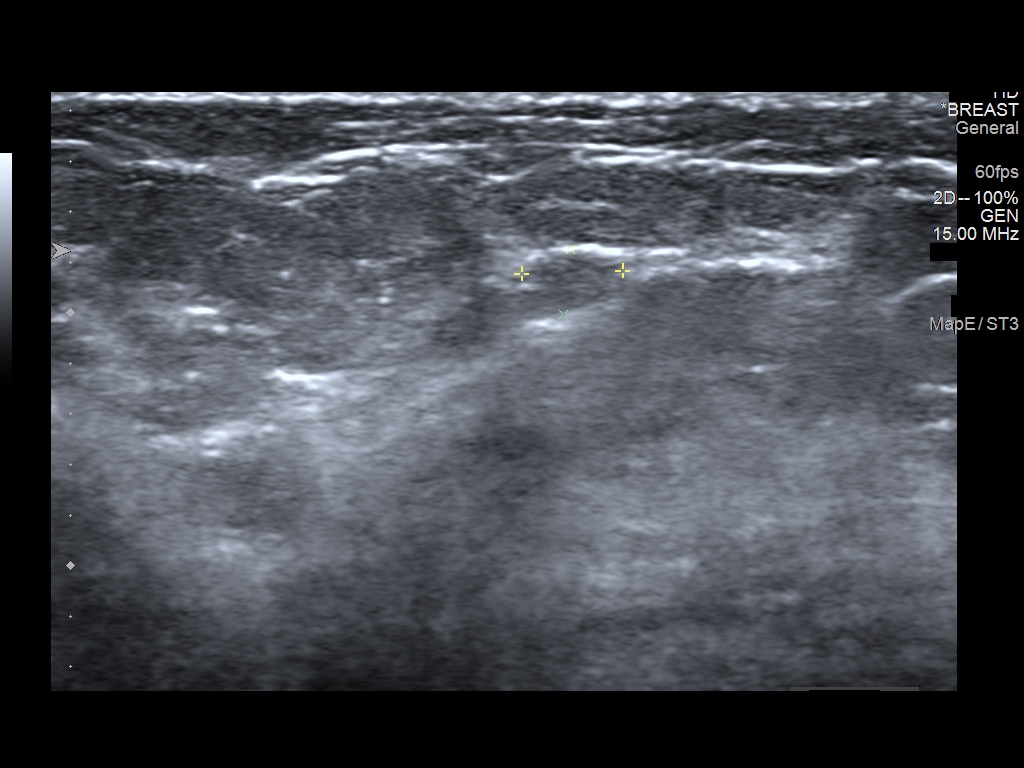
[im 4/7]
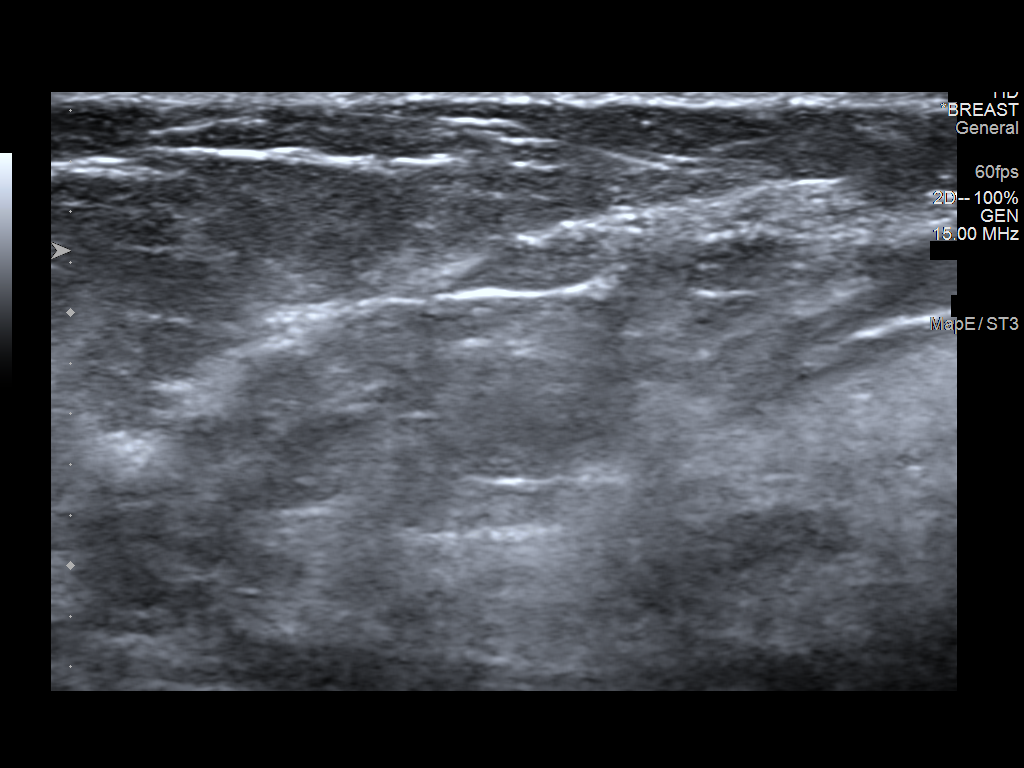
[im 5/7]
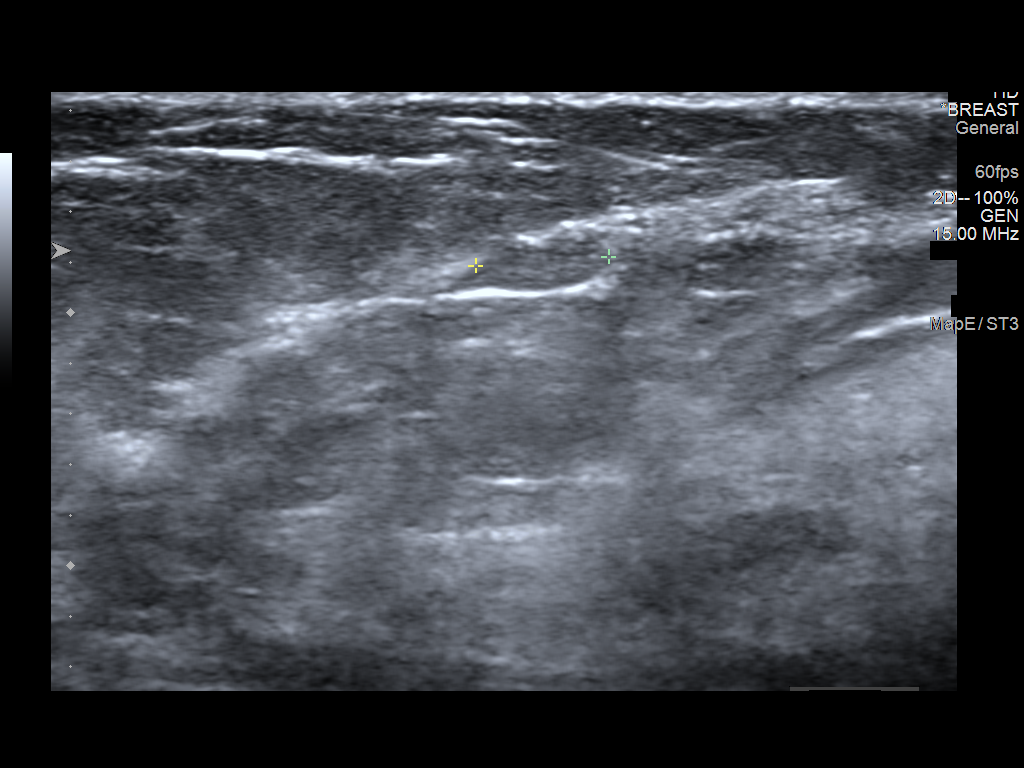
[im 6/7]
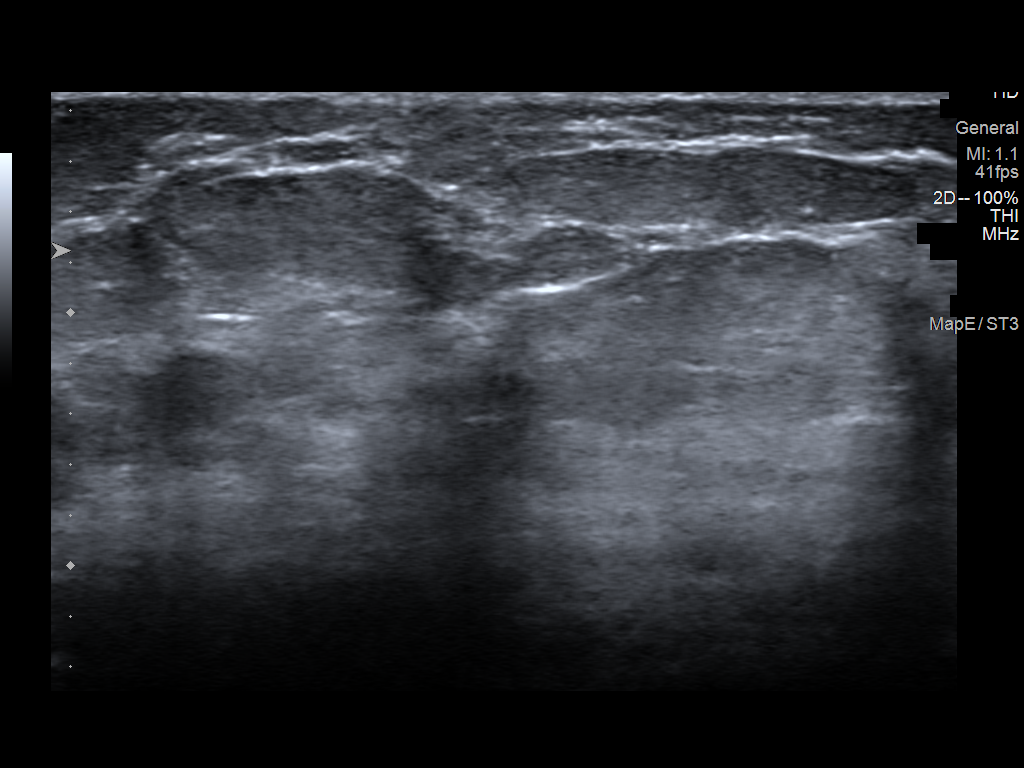
[im 7/7]
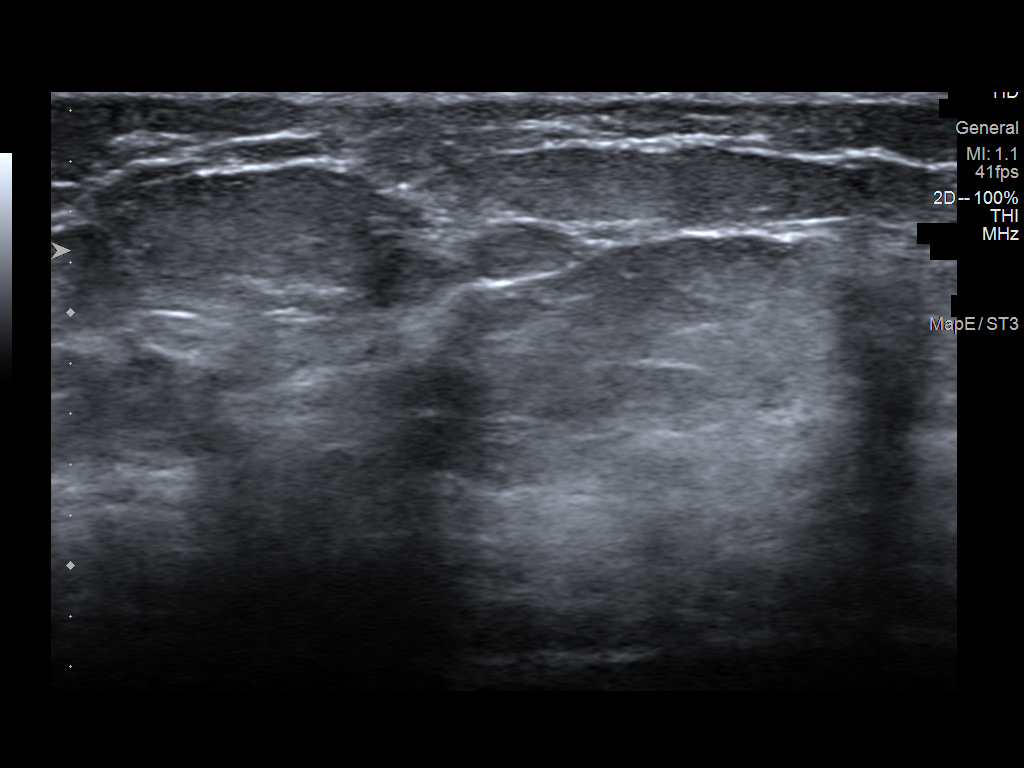

[7 of 7 positions shown; findings below may reference images not displayed]

FINDINGS: Targeted LEFT breast ultrasound is performed, showing an oval
circumscribed parallel isoechoic mass at the 7 o'clock position
approximately 4 cm from the nipple measuring approximately 3 x 4 x 5
mm, demonstrating no posterior characteristics and no internal power
Doppler flow, felt to correlate with the MRI findings. The mass was
difficult to identify as it is isoechoic to surrounding fat. No
suspicious solid mass or abnormal acoustic shadowing is identified
with imaging from [DATE] through [DATE] to [DATE].
IMPRESSION: Likely benign 5 mm mass involving the LOWER INNER QUADRANT of the
LEFT breast, possibly fibroadenoma. The mass is difficult to
identify on ultrasound as it is isoechoic to surrounding fat.

RECOMMENDATION:
As the mass is better seen on MRI, follow-up MRI of the breasts in 6
months is recommended to confirm stability.

I have discussed the findings and recommendations with the patient.
If applicable, a reminder letter will be sent to the patient
regarding the next appointment.

BI-RADS CATEGORY  3: Probably benign.

## 2021-01-09 ENCOUNTER — Ambulatory Visit
Admission: EM | Admit: 2021-01-09 | Discharge: 2021-01-09 | Disposition: A | Discharge status: Home / Self Care | Payer: Managed Care, Other (non HMO) | Attending: Physician Assistant | Admitting: Physician Assistant

## 2021-01-09 ENCOUNTER — Encounter: Payer: Self-pay | Admitting: Emergency Medicine

## 2021-01-09 ENCOUNTER — Other Ambulatory Visit: Payer: Self-pay

## 2021-01-09 DIAGNOSIS — Z113 Encounter for screening for infections with a predominantly sexual mode of transmission: Secondary | ICD-10-CM | POA: Insufficient documentation

## 2021-01-09 NOTE — ED Provider Notes (Signed)
[facility]    CSN: [id number] Arrival date & time: 01/09/21  1708      History   Chief Complaint Chief Complaint  Patient presents with   Exposure to STD    HPI Rhonda Rocha is a 50 y.o. female.   Patient here today for routine STD screening.  She reports that she has a new partner and just want screening to be on the safe side.  She denies any current symptoms.  The history is provided by the patient.  Exposure to STD Pertinent negatives include no abdominal pain and no shortness of breath.   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   Numbness and tingling 01/01/2013   IRREGULAR MENSES 01/01/2010   FOOT PAIN 03/24/2009   PES PLANUS 03/16/2008   HAIR LOSS 02/11/2008   ACUTE CYSTITIS 01/04/2008   DEPRESSION 10/22/2007   MICROSCOPIC HEMATURIA 10/22/2007   BREAST TENDERNESS 10/22/2007   PERIPHERAL EDEMA 05/19/2007   CANDIDIASIS, VAGINAL 03/02/2007   VAGINAL DISCHARGE 03/02/2007   LOSS OF APPETITE 08/30/2006    Past Surgical History:  Procedure Laterality Date   BREAST BIOPSY Left 08/21/2018   Procedure: LEFT BREAST BIOPSY;  Surgeon: Johnathan Hausen, MD;  Location: [place];  Service: General;  Laterality: Left;   CESAREAN SECTION WITH BILATERAL TUBAL LIGATION  2011   MASTECTOMY, PARTIAL Left 10/16/2018   Procedure: LEFT BREAST PARTIAL MASTECTOMY;  Surgeon: Johnathan Hausen, MD;  Location: [place];  Service: General;  Laterality: Left;   Uterine Ablation      OB History   No obstetric history  on file.      Home Medications    Prior to Admission medications   Medication Sig Start Date End Date Taking? Authorizing Provider  acetaminophen (TYLENOL) 325 MG tablet Take 650 mg by mouth every 6 (six) hours as needed.    [provider]  albuterol (VENTOLIN HFA) 108 (90 Base) MCG/ACT inhaler Inhale 1-2 puffs into the lungs every 6 (six) hours as needed for wheezing or shortness of breath. 07/22/19   Ok Edwards, PA-C  clotrimazole (LOTRIMIN) 1 % cream Apply to affected area 2 times daily 07/16/20   Wieters, Hallie C, PA-C  ibuprofen (ADVIL) 400 MG tablet Take 400 mg by mouth every 6 (six) hours as needed.    [provider]  Lidocaine 2 % GEL Apply 1 g topically every 4 (four) hours as needed. 07/16/20   Wieters, Elesa Hacker, PA-C  NON FORMULARY otc medicine    [provider]    Family History Family History  Problem Relation Age of Onset   Healthy Mother    Other Father 10       "murdered"   Breast cancer Maternal Aunt    Prostate cancer Maternal Uncle    Breast cancer Paternal Aunt 60   Breast cancer Maternal Aunt 38   Prostate cancer Maternal Uncle 66       d. 8   Stomach  cancer Cousin 53    Social History Social History   Tobacco Use   Smoking status: Never   Smokeless tobacco: Never  Substance Use Topics   Alcohol use: No   Drug use: Never     Allergies   Patient has no known allergies.   Review of Systems Review of Systems  Constitutional:  Negative for chills and fever.  Eyes:  Negative for discharge and redness.  Respiratory:  Negative for shortness of breath.   Gastrointestinal:  Negative for abdominal pain, nausea and vomiting.  Genitourinary:  Positive for vaginal bleeding. Negative for dysuria and vaginal discharge.    Physical Exam Triage Vital Signs ED Triage Vitals [01/09/21 1720]  Enc Vitals Group     BP 135/75     Pulse Rate 75     Resp      Temp 98.5 F (36.9 C)     Temp Source Oral     SpO2 98 %     Weight  165 lb (74.8 kg)     Height 5\' 7"  (1.702 m)     Head Circumference      Peak Flow      Pain Score 0     Pain Loc      Pain Edu?      Excl. in [place]?    No data found.  Updated Vital Signs BP 135/75 (BP Location: Left Arm)    Pulse 75    Temp 98.5 F (36.9 C) (Oral)    Ht 5\' 7"  (1.702 m)    Wt 165 lb (74.8 kg)    SpO2 98%    BMI 25.84 kg/m    Physical Exam Vitals and nursing note reviewed.  Constitutional:      General: She is not in acute distress.    Appearance: Normal appearance. She is not ill-appearing.  HENT:     Head: Normocephalic and atraumatic.  Eyes:     Conjunctiva/sclera: Conjunctivae normal.  Cardiovascular:     Rate and Rhythm: Normal rate.  Pulmonary:     Effort: Pulmonary effort is normal.  Neurological:     Mental Status: She is alert.  Psychiatric:        Mood and Affect: Mood normal.        Behavior: Behavior normal.        Thought Content: Thought content normal.     UC Treatments / Results  Labs (all labs ordered are listed, but only abnormal results are displayed) Labs Reviewed  HIV ANTIBODY (ROUTINE TESTING W REFLEX)  RPR  HEPATITIS PANEL, ACUTE  CERVICOVAGINAL ANCILLARY ONLY    EKG   Radiology No results found.  Procedures Procedures (including critical care time)  Medications Ordered in UC Medications - No data to display  Initial Impression / Assessment and Plan / UC Course  I have reviewed the triage vital signs and the nursing notes.  Pertinent labs & imaging results that were available during my care of the patient were reviewed by me and considered in my medical decision making (see chart for details).    STD screening ordered as requested.  Will await results further recommendation.  Final Clinical Impressions(s) / UC Diagnoses   Final diagnoses:  Screening for STD (sexually transmitted disease)   Discharge Instructions   None    ED Prescriptions   None    PDMP not reviewed this encounter.   Francene Finders, PA-C 01/09/21 1749

## 2021-01-09 NOTE — ED Triage Notes (Signed)
Patient states that she has a new partner and requesting all STD testing.  No sx's other than a possible yeast infection.

## 2021-01-10 LAB — CERVICOVAGINAL ANCILLARY ONLY
Bacterial Vaginitis (gardnerella): NEGATIVE
Candida Glabrata: NEGATIVE
Candida Vaginitis: NEGATIVE
Chlamydia: NEGATIVE
Comment: NEGATIVE
Comment: NEGATIVE
Comment: NEGATIVE
Comment: NEGATIVE
Comment: NEGATIVE
Comment: NORMAL
Neisseria Gonorrhea: NEGATIVE
Trichomonas: NEGATIVE

## 2021-01-10 LAB — HIV ANTIBODY (ROUTINE TESTING W REFLEX): HIV Screen 4th Generation wRfx: NONREACTIVE

## 2021-01-10 LAB — RPR: RPR Ser Ql: NONREACTIVE

## 2021-01-23 LAB — SPECIMEN STATUS REPORT

## 2021-01-24 ENCOUNTER — Ambulatory Visit (HOSPITAL_COMMUNITY)
Admission: EM | Admit: 2021-01-24 | Discharge: 2021-01-24 | Disposition: A | Discharge status: Home / Self Care | Payer: Managed Care, Other (non HMO) | Attending: Family Medicine | Admitting: Family Medicine

## 2021-01-24 ENCOUNTER — Encounter (HOSPITAL_COMMUNITY): Payer: Self-pay

## 2021-01-24 ENCOUNTER — Other Ambulatory Visit: Payer: Self-pay

## 2021-01-24 DIAGNOSIS — R300 Dysuria: Secondary | ICD-10-CM | POA: Diagnosis not present

## 2021-01-24 DIAGNOSIS — N898 Other specified noninflammatory disorders of vagina: Secondary | ICD-10-CM | POA: Diagnosis not present

## 2021-01-24 LAB — POCT URINALYSIS DIPSTICK, ED / UC
Bilirubin Urine: NEGATIVE
Glucose, UA: NEGATIVE mg/dL
Ketones, ur: 40 mg/dL — AB
Leukocytes,Ua: NEGATIVE
Nitrite: NEGATIVE
Protein, ur: NEGATIVE mg/dL
Specific Gravity, Urine: >=1.03 (ref 1.005–1.030)
Urobilinogen, UA: 0.2 mg/dL (ref 0.0–1.0)
pH: 6 (ref 5.0–8.0)

## 2021-01-24 MED ORDER — METRONIDAZOLE 500 MG PO TABS: 500.0000 mg | ORAL_TABLET | Freq: Two times a day (BID) | ORAL | 0 refills | Status: DC

## 2021-01-24 NOTE — Discharge Instructions (Signed)
We have sent testing for causes of vaginal infections as well as a urine culture. We will notify you of any positive results once they are received. If required, we will prescribe any medications you might need.  Please refrain from all sexual activity for at least the next seven days.

## 2021-01-24 NOTE — ED Triage Notes (Signed)
Pt presents with c/o vaginal irritation x 2 days.

## 2021-01-25 ENCOUNTER — Telehealth (HOSPITAL_COMMUNITY): Payer: Self-pay | Admitting: Family Medicine

## 2021-01-25 ENCOUNTER — Telehealth (HOSPITAL_COMMUNITY): Payer: Self-pay

## 2021-01-25 NOTE — Telephone Encounter (Signed)
Error- Ignore

## 2021-01-25 NOTE — ED Provider Notes (Signed)
[place]   [id number] 01/24/21 Arrival Time: 1935  ASSESSMENT & PLAN:  1. Vaginal irritation   2. Dysuria    Will treat empirically for BV.  Meds ordered this encounter  Medications   metroNIDAZOLE (FLAGYL) 500 MG tablet    Sig: Take 1 tablet (500 mg total) by mouth 2 (two) times daily.    Dispense:  14 tablet    Refill:  0   Urine culture pending.    Discharge Instructions      We have sent testing for causes of vaginal infections as well as a urine culture. We will notify you of any positive results once they are received. If required, we will prescribe any medications you might need.  Please refrain from all sexual activity for at least the next seven days.     Without s/s of PID.  Labs Reviewed  POCT URINALYSIS DIPSTICK, ED / UC - Abnormal; Notable for the following components:      Result Value   Ketones, ur 40 (*)    Hgb urine dipstick MODERATE (*)    All other components within normal limits  URINE CULTURE   Vaginal cytology ordered.  Will notify of any positive results. Instructed to refrain from sexual activity for at least seven days.  Reviewed expectations re: course of current medical issues. Questions answered. Outlined signs and symptoms indicating need for more acute intervention. Patient verbalized understanding. After Visit Summary given.   SUBJECTIVE:  Rhonda Rocha is a 51 y.o. female who presents with complaint of vaginal irritation and mild dysuria. Onset gradual. First noticed  x 2 days . Afebrile. No abdominal or pelvic pain. Normal PO intake wihout n/v. No genital rashes or lesions. H/O BV with similar symptoms.  No LMP recorded. (Menstrual status: Perimenopausal).   OBJECTIVE:  Vitals:   01/24/21 2026 01/24/21 2027  BP:  136/87  Pulse: 75   Resp: 19   TempSrc: Oral   SpO2: 97%      General appearance: alert, cooperative, appears stated age and no distress Lungs: unlabored respirations; speaks full  sentences without difficulty Back: no CVA tenderness; FROM at waist Abdomen: soft, non-tender GU: deferred Skin: warm and dry Psychological: alert and cooperative; normal mood and affect.  Results for orders placed or performed during the hospital encounter of 01/24/21  POCT Urinalysis Dipstick (ED/UC)  Result Value Ref Range   Glucose, UA NEGATIVE NEGATIVE mg/dL   Bilirubin Urine NEGATIVE NEGATIVE   Ketones, ur 40 (A) NEGATIVE mg/dL   Specific Gravity, Urine >=1.030 1.005 - 1.030   Hgb urine dipstick MODERATE (A) NEGATIVE   pH 6.0 5.0 - 8.0   Protein, ur NEGATIVE NEGATIVE mg/dL   Urobilinogen, UA 0.2 0.0 - 1.0 mg/dL   Nitrite NEGATIVE NEGATIVE   Leukocytes,Ua NEGATIVE NEGATIVE    Labs Reviewed  POCT URINALYSIS DIPSTICK, ED / UC - Abnormal; Notable for the following components:      Result Value   Ketones, ur 40 (*)    Hgb urine dipstick MODERATE (*)    All other components within normal limits  URINE CULTURE    No Known Allergies  Past Medical History:  Diagnosis Date   Cancer ([place])    Family history of breast cancer    Family history of prostate cancer    Family history of stomach cancer    Personal history of radiation therapy    Family History  Problem Relation Age of Onset   Healthy Mother    Other Father  53       "murdered"   Breast cancer Maternal Aunt    Prostate cancer Maternal Uncle    Breast cancer Paternal Aunt 38   Breast cancer Maternal Aunt 45   Prostate cancer Maternal Uncle 66       d. 69   Stomach cancer Cousin 39   Social History   Socioeconomic History   Marital status: Single    Spouse name: Not on file   Number of children: Not on file   Years of education: Not on file   Highest education level: Not on file  Occupational History   Not on file  Tobacco Use   Smoking status: Never   Smokeless tobacco: Never  Substance and Sexual Activity   Alcohol use: No   Drug use: Never   Sexual activity: Not on file  Other Topics Concern    Not on file  Social History Narrative   Not on file   Social Determinants of Health   Financial Resource Strain: Not on file  Food Insecurity: Not on file  Transportation Needs: Not on file  Physical Activity: Not on file  Stress: Not on file  Social Connections: Not on file  Intimate Partner Violence: Not on file           Vanessa Kick, MD 01/25/21 1013

## 2021-01-26 LAB — CERVICOVAGINAL ANCILLARY ONLY
Bacterial Vaginitis (gardnerella): NEGATIVE
Candida Glabrata: NEGATIVE
Candida Vaginitis: NEGATIVE
Chlamydia: NEGATIVE
Comment: NEGATIVE
Comment: NEGATIVE
Comment: NEGATIVE
Comment: NEGATIVE
Comment: NEGATIVE
Comment: NORMAL
Neisseria Gonorrhea: NEGATIVE
Trichomonas: NEGATIVE

## 2021-01-26 LAB — URINE CULTURE: Culture: 10000 — AB

## 2021-02-15 ENCOUNTER — Other Ambulatory Visit: Payer: Self-pay

## 2021-02-15 ENCOUNTER — Emergency Department (HOSPITAL_COMMUNITY): Payer: Managed Care, Other (non HMO)

## 2021-02-15 ENCOUNTER — Encounter (HOSPITAL_COMMUNITY): Payer: Self-pay | Admitting: Emergency Medicine

## 2021-02-15 ENCOUNTER — Emergency Department (HOSPITAL_COMMUNITY)
Admission: EM | Admit: 2021-02-15 | Discharge: 2021-02-15 | Disposition: A | Discharge status: Home / Self Care | Payer: Managed Care, Other (non HMO) | Attending: Emergency Medicine | Admitting: Emergency Medicine

## 2021-02-15 ENCOUNTER — Ambulatory Visit: Admit: 2021-02-15 | Disposition: A | Discharge status: Home / Self Care | Payer: Managed Care, Other (non HMO)

## 2021-02-15 DIAGNOSIS — R519 Headache, unspecified: Secondary | ICD-10-CM | POA: Insufficient documentation

## 2021-02-15 DIAGNOSIS — M5412 Radiculopathy, cervical region: Secondary | ICD-10-CM

## 2021-02-15 DIAGNOSIS — M25512 Pain in left shoulder: Secondary | ICD-10-CM | POA: Diagnosis present

## 2021-02-15 MED ORDER — METHYL SALICYLATE-LIDO-MENTHOL 4-4-5 % EX PTCH: 1.0000 | MEDICATED_PATCH | Freq: Two times a day (BID) | CUTANEOUS | 0 refills | Status: AC

## 2021-02-15 MED ORDER — HYDROCODONE-ACETAMINOPHEN 7.5-325 MG/15ML PO SOLN: 15.0000 mL | Freq: Three times a day (TID) | ORAL | 0 refills | Status: AC | PRN

## 2021-02-15 MED ORDER — KETOROLAC TROMETHAMINE 30 MG/ML IJ SOLN
30.0000 mg | Freq: Once | INTRAMUSCULAR | Status: AC
  Administered 2021-02-15: 30 mg via INTRAMUSCULAR
  Filled 2021-02-15: qty 1

## 2021-02-15 MED ORDER — PREDNISONE 20 MG PO TABS: 40.0000 mg | ORAL_TABLET | Freq: Every day | ORAL | 0 refills | Status: DC

## 2021-02-15 MED ORDER — HYDROCODONE-ACETAMINOPHEN 5-325 MG PO TABS
1.0000 | ORAL_TABLET | Freq: Once | ORAL | Status: AC
  Administered 2021-02-15: 1 via ORAL
  Filled 2021-02-15: qty 1

## 2021-02-15 MED ORDER — DEXAMETHASONE SODIUM PHOSPHATE 10 MG/ML IJ SOLN
10.0000 mg | Freq: Once | INTRAMUSCULAR | Status: AC
  Administered 2021-02-15: 10 mg via INTRAMUSCULAR
  Filled 2021-02-15: qty 1

## 2021-02-15 MED ORDER — HYDROCODONE-ACETAMINOPHEN 5-325 MG PO TABS: 2.0000 | ORAL_TABLET | ORAL | 0 refills | Status: DC | PRN

## 2021-02-15 NOTE — ED Notes (Signed)
Patient verbalizes understanding of discharge instructions. Opportunity for questioning and answers were provided. Armband removed by staff, pt discharged from ED. Ambulated out to lobby  

## 2021-02-15 NOTE — ED Triage Notes (Signed)
Pt here from home w/ L shoulder pain and numbness and tingling that radiates down L arm x2 weeks. Pt has tried heat, massage, muscle relaxer's, 800mg  ibuprofen, alieve and stretches w/o relief. Pt has no pain w/ movement of arm.

## 2021-02-15 NOTE — ED Provider Triage Note (Signed)
Emergency Medicine Provider Triage Evaluation Note  Rhonda Rocha , a 51 y.o. female  was evaluated in triage.  Pt complains of left shoulder pain onset 2 weeks.  Patient denies recent injury, heavy lifting, trauma.  She notes that she has tried over-the-counter medications, muscle relaxant, no relief of her symptoms. Denies chest pain, shortness of breath, fever, chills, nausea, vomiting.  Has not been evaluated for her symptoms.   Review of Systems  Positive: As per HPI above Negative: Chest pain, shortness of breath  Physical Exam  BP (!) 133/93 (BP Location: Right Arm)    Pulse 71    Temp 98.4 F (36.9 C) (Oral)    Resp 16    SpO2 100%  Gen:   Awake, no distress   Resp:  Normal effort  MSK:   Moves extremities without difficulty  Other:  Full active range of motion of left shoulder.  No overlying skin changes.  No tenderness to palpation noted to left clavicle, left shoulder, left humerus, left elbow, left hand.  Strength intact to bilateral upper extremities.  Tenderness to palpation noted to left trapezius.  No spinal tenderness to palpation.  Medical Decision Making  Medically screening exam initiated at 12:17 PM.  Appropriate orders placed.  Rhonda Rocha was informed that the remainder of the evaluation will be completed by another provider, this initial triage assessment does not replace that evaluation, and the importance of remaining in the ED until their evaluation is complete.    Jesua Tamblyn A, PA-C 02/15/21 1321

## 2021-02-15 NOTE — ED Provider Notes (Addendum)
[facility] EMERGENCY DEPARTMENT Provider Note   CSN: [id number] Arrival date & time: 02/15/21  1152     History  Chief Complaint  Patient presents with   Numbness   Shoulder Pain    Rhonda Rocha is a 51 y.o. female.  HPI Adult female who is generally well until about 2 weeks ago presents with current numbness throughout her left arm, heaviness in her scapular region.  Onset was initially headache, diffuse, though this is improved, and is not currently an issue, she has worsening discomfort in her arm, prompting evaluation.  No complete loss of sensation, though she does have numbness throughout, no chest pain, syncope, dyspnea.  No clear alleviating or exacerbating factors.    Home Medications Prior to Admission medications   Medication Sig Start Date End Date Taking? Authorizing Provider  acetaminophen (TYLENOL) 325 MG tablet Take 650 mg by mouth every 6 (six) hours as needed.    [provider]  albuterol (VENTOLIN HFA) 108 (90 Base) MCG/ACT inhaler Inhale 1-2 puffs into the lungs every 6 (six) hours as needed for wheezing or shortness of breath. 07/22/19   Ok Edwards, PA-C  clotrimazole (LOTRIMIN) 1 % cream Apply to affected area 2 times daily 07/16/20   Wieters, Hallie C, PA-C  ibuprofen (ADVIL) 400 MG tablet Take 400 mg by mouth every 6 (six) hours as needed.    [provider]  Lidocaine 2 % GEL Apply 1 g topically every 4 (four) hours as needed. 07/16/20   Wieters, Hallie C, PA-C  metroNIDAZOLE (FLAGYL) 500 MG tablet Take 1 tablet (500 mg total) by mouth 2 (two) times daily. 01/24/21   Vanessa Kick, MD  NON [facility] otc medicine    [provider]      Allergies    Patient has no known allergies.    Review of Systems   Review of Systems  Constitutional:        Per HPI, otherwise negative  HENT:         Per HPI, otherwise negative  Respiratory:         Per HPI, otherwise negative  Cardiovascular:        Per HPI,  otherwise negative  Gastrointestinal:  Negative for vomiting.  Endocrine:       Negative aside from HPI  Genitourinary:        Neg aside from HPI   Musculoskeletal:        Per HPI, otherwise negative  Skin: Negative.   Neurological:  Positive for numbness and headaches. Negative for syncope.   Physical Exam Updated Vital Signs BP (!) 133/93 (BP Location: Right Arm)    Pulse 71    Temp 98.4 F (36.9 C) (Oral)    Resp 16    SpO2 100%  Physical Exam Vitals and nursing note reviewed.  Constitutional:      General: She is not in acute distress.    Appearance: She is well-developed.  HENT:     Head: Normocephalic and atraumatic.  Eyes:     Conjunctiva/sclera: Conjunctivae normal.  Neck:     Comments: Left superior trapezius discomfort with palpation Cardiovascular:     Rate and Rhythm: Normal rate and regular rhythm.  Pulmonary:     Effort: Pulmonary effort is normal. No respiratory distress.     Breath sounds: Normal breath sounds. No stridor.  Abdominal:     General: There is no distension.  Musculoskeletal:     Comments: ROM R shoulder unremarkable.  No cutaneous changes.  She describes that the heaviness is within the scapular region when pressure is applied, but is not actually painful.  Skin:    General: Skin is warm and dry.  Neurological:     Mental Status: She is alert and oriented to person, place, and time.     Cranial Nerves: No cranial nerve deficit.     Comments: No focal deficits, moves all extremity spontaneously, strength is appropriate, face is symmetric, speech is clear.    ED Results / Procedures / Treatments   Labs (all labs ordered are listed, but only abnormal results are displayed) Labs Reviewed - No data to display  EKG None  Radiology DG Shoulder Left  Result Date: 02/15/2021 CLINICAL DATA:  Numbness and tingling radiating down left arm for 2 weeks EXAM: LEFT SHOULDER - 2+ VIEW COMPARISON:  None. FINDINGS: There is no acute fracture or  dislocation. Glenohumeral and acromioclavicular alignment is normal. The joint spaces are preserved. The soft tissues are unremarkable. IMPRESSION: No acute fracture or dislocation. Electronically Signed   By: Valetta Mole M.D.   On: 02/15/2021 14:49    Procedures Procedures    Medications Ordered in ED Medications  ketorolac (TORADOL) 30 MG/ML injection 30 mg (30 mg Intramuscular Given 02/15/21 1619)   Previously well adult female presents with left arm numbness after initially having a headache.  Neuro exam reassuring, but with consideration of intracranial versus cervical radiculopathy etiology patient had CT scan, received Toradol, x-ray. ED Course/ Medical Decision Making/ A&P  7:32 PM Repeat exam patient is awake, alert, in no distress, speaking clearly.  She has received Toradol, Vicodin, Decadron.  I have interpreted her CT scans, noted for degenerative changes, lower cervical spine, consistent with possible radiculopathy.  She has no chest pain, no back pain, no objective neuro deficiencies, low suspicion for other acute phenomena such as dissection or infection.  Patient appropriate for initiation of meds, follow-up with spine specialist, primary care. No obstacles to appropriate follow-up or obtaining medications.                         Medical Decision Making Problems Addressed: Acute pain of left shoulder: acute illness or injury Cervical radiculopathy: acute illness or injury  Amount and/or Complexity of Data Reviewed Radiology: ordered and independent interpretation performed. Decision-making details documented in ED Course.  Risk OTC drugs. Prescription drug management.  Critical Care Total time providing critical care: < 30 minutes     8:01 PM Patient's pharmacy did not have some of the prescribed medication in stock, changes made.   Final Clinical Impression(s) / ED Diagnoses Final diagnoses:  Cervical radiculopathy  Acute pain of left shoulder    Rx / DC  Orders ED Discharge Orders     None         Carmin Muskrat, MD 02/15/21 6314    Carmin Muskrat, MD 02/15/21 2001

## 2021-02-15 NOTE — Discharge Instructions (Signed)
As discussed, today's evaluation has been generally reassuring.  However, there is evidence that your spine in your neck has some degenerative changes, and there may be pressure on the nerves as they exit and provide sensation from your shoulder and arm.  Medication provided should improve your condition, but it is important you follow-up with your primary care physician and our neurology colleagues for appropriate ongoing outpatient management.  Return here for concerning changes in your condition.

## 2021-03-08 ENCOUNTER — Encounter: Payer: Self-pay | Admitting: Emergency Medicine

## 2021-03-08 ENCOUNTER — Ambulatory Visit
Admission: EM | Admit: 2021-03-08 | Discharge: 2021-03-08 | Disposition: A | Discharge status: Home / Self Care | Payer: Managed Care, Other (non HMO) | Attending: Internal Medicine | Admitting: Internal Medicine

## 2021-03-08 ENCOUNTER — Other Ambulatory Visit: Payer: Self-pay

## 2021-03-08 DIAGNOSIS — R300 Dysuria: Secondary | ICD-10-CM | POA: Diagnosis not present

## 2021-03-08 DIAGNOSIS — N898 Other specified noninflammatory disorders of vagina: Secondary | ICD-10-CM | POA: Insufficient documentation

## 2021-03-08 LAB — POCT URINALYSIS DIP (MANUAL ENTRY)
Bilirubin, UA: NEGATIVE
Glucose, UA: NEGATIVE mg/dL
Ketones, POC UA: NEGATIVE mg/dL
Nitrite, UA: NEGATIVE
Protein Ur, POC: NEGATIVE mg/dL
Spec Grav, UA: 1.025 (ref 1.010–1.025)
Urobilinogen, UA: 1 E.U./dL
pH, UA: 5.5 (ref 5.0–8.0)

## 2021-03-08 NOTE — Discharge Instructions (Signed)
Your urine was negative for infection.  Suspect vaginal infection as cause of your symptoms.  Your urine culture and vaginal swab are pending.  We will call if they are positive and treat as appropriate.

## 2021-03-08 NOTE — ED Triage Notes (Signed)
C/o UTI and BV symptoms per patient

## 2021-03-08 NOTE — ED Provider Notes (Signed)
[facility]    CSN: [id number] Arrival date & time: 03/08/21  1851      History   Chief Complaint Chief Complaint  Patient presents with   Vaginal Itching    HPI Rhonda Rocha is a 51 y.o. female.   Patient presents with vaginal irritation and urinary burning that started a few days ago.  Patient denies vaginal discharge, pelvic pain, urinary frequency, hematuria, abdominal pain, fever, back pain.  Denies any known exposure to STD.  Last menstrual cycle was unknown as patient reports that she does not have any menstrual cycles at baseline.   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   Numbness and tingling 01/01/2013   IRREGULAR MENSES 01/01/2010   FOOT PAIN 03/24/2009   PES PLANUS 03/16/2008   HAIR LOSS 02/11/2008   ACUTE CYSTITIS 01/04/2008   DEPRESSION 10/22/2007   MICROSCOPIC HEMATURIA 10/22/2007   BREAST TENDERNESS 10/22/2007   PERIPHERAL EDEMA 05/19/2007   CANDIDIASIS, VAGINAL 03/02/2007   VAGINAL DISCHARGE 03/02/2007   LOSS OF APPETITE 08/30/2006    Past Surgical History:  Procedure Laterality Date   BREAST BIOPSY Left 08/21/2018   Procedure: LEFT BREAST BIOPSY;  Surgeon: Johnathan Hausen, MD;  Location: [place];  Service: General;  Laterality: Left;   CESAREAN SECTION WITH BILATERAL TUBAL LIGATION  2011   MASTECTOMY, PARTIAL Left 10/16/2018   Procedure: LEFT BREAST PARTIAL MASTECTOMY;  Surgeon: Johnathan Hausen, MD;  Location: [place];  Service: General;  Laterality: Left;    Uterine Ablation      OB History   No obstetric history on file.      Home Medications    Prior to Admission medications   Medication Sig Start Date End Date Taking? Authorizing Provider  acetaminophen (TYLENOL) 325 MG tablet Take 650 mg by mouth every 6 (six) hours as needed.    [provider]  albuterol (VENTOLIN HFA) 108 (90 Base) MCG/ACT inhaler Inhale 1-2 puffs into the lungs every 6 (six) hours as needed for wheezing or shortness of breath. 07/22/19   Ok Edwards, PA-C  clotrimazole (LOTRIMIN) 1 % cream Apply to affected area 2 times daily 07/16/20   Wieters, [employer] C, PA-C  HYDROcodone-acetaminophen (HYCET) 7.5-325 mg/15 ml solution Take 15 mLs by mouth 3 (three) times daily as needed for moderate pain. 02/15/21 02/15/22  Carmin Muskrat, MD  ibuprofen (ADVIL) 400 MG tablet Take 400 mg by mouth every 6 (six) hours as needed.    [provider]  Lidocaine 2 % GEL Apply 1 g topically every 4 (four) hours as needed. 07/16/20   Wieters, Hallie C, PA-C  Methyl Salicylate-Lido-Menthol 4-4-5 % PTCH Apply 1 patch topically in the morning and at bedtime. 02/15/21   Carmin Muskrat, MD  metroNIDAZOLE (FLAGYL) 500 MG tablet Take 1 tablet (500 mg total) by mouth 2 (two) times daily. 01/24/21   Vanessa Kick, MD  NON [facility] otc medicine    [provider]  predniSONE (DELTASONE) 20 MG tablet Take 2 tablets (40 mg total) by mouth daily with breakfast. For the next four days 02/15/21   Carmin Muskrat, MD    Family History Family History  Problem Relation Age of Onset   Healthy Mother    Other Father 12       "murdered"   Breast cancer Maternal Aunt    Prostate cancer Maternal Uncle    Breast cancer Paternal Aunt 108   Breast cancer Maternal Aunt 45   Prostate cancer Maternal Uncle 66       d. 93   Stomach cancer Cousin 81    Social History Social History   Tobacco Use   Smoking status: Never   Smokeless tobacco: Never  Substance Use Topics   Alcohol  use: No   Drug use: Never     Allergies   Patient has no known allergies.   Review of Systems Review of Systems Per HPI  Physical Exam Triage Vital Signs ED Triage Vitals  Enc Vitals Group     BP 03/08/21 1902 117/80     Pulse Rate 03/08/21 1902 74     Resp 03/08/21 1902 20     Temp 03/08/21 1902 98.6 F (37 C)     Temp Source 03/08/21 1902 Oral     SpO2 03/08/21 1902 98 %     Weight --      Height --      Head Circumference --      Peak Flow --      Pain Score 03/08/21 1903 0     Pain Loc --      Pain Edu? --      Excl. in [place]? --    No data found.  Updated Vital Signs BP 117/80 (BP Location: Left Arm)    Pulse 74    Temp 98.6 F (37 C) (Oral)    Resp 20    SpO2 98%   Visual Acuity Right Eye Distance:   Left Eye Distance:   Bilateral Distance:    Right Eye Near:   Left Eye Near:    Bilateral Near:     Physical Exam Constitutional:      General: She is not in acute distress.    Appearance: Normal appearance. She is not toxic-appearing or diaphoretic.  HENT:     Head: Normocephalic and atraumatic.  Eyes:     Extraocular Movements: Extraocular movements intact.     Conjunctiva/sclera: Conjunctivae normal.  Cardiovascular:     Rate and Rhythm: Normal rate and regular rhythm.     Pulses: Normal pulses.     Heart sounds: Normal heart sounds.  Pulmonary:     Effort: Pulmonary effort is normal. No respiratory distress.     Breath sounds: Normal breath sounds.  Abdominal:     General: Abdomen is flat. Bowel sounds are normal. There is no distension.     Palpations: Abdomen is soft.     Tenderness: There is no abdominal tenderness.  Genitourinary:    Comments: Deferred with shared decision making.  Self swab performed. Skin:    General: Skin is warm and dry.  Neurological:     General: No focal deficit present.     Mental Status: She is alert and oriented to person, place, and time. Mental status is at baseline.  Psychiatric:        Mood and Affect:  Mood normal.        Behavior: Behavior normal.  Thought Content: Thought content normal.        Judgment: Judgment normal.     UC Treatments / Results  Labs (all labs ordered are listed, but only abnormal results are displayed) Labs Reviewed  POCT URINALYSIS DIP (MANUAL ENTRY) - Abnormal; Notable for the following components:      Result Value   Clarity, UA cloudy (*)    Blood, UA large (*)    Leukocytes, UA Trace (*)    All other components within normal limits  URINE CULTURE  CERVICOVAGINAL ANCILLARY ONLY    EKG   Radiology No results found.  Procedures Procedures (including critical care time)  Medications Ordered in UC Medications - No data to display  Initial Impression / Assessment and Plan / UC Course  I have reviewed the triage vital signs and the nursing notes.  Pertinent labs & imaging results that were available during my care of the patient were reviewed by me and considered in my medical decision making (see chart for details).     Urinalysis not indicating urinary tract infection.  Suspect vaginal infection as patient's cause of symptoms.  Urine culture and cervicovaginal swab is pending.  Discussed return precautions.  Patient verbalized understanding and was agreeable with plan. Final Clinical Impressions(s) / UC Diagnoses   Final diagnoses:  Vaginal irritation  Dysuria     Discharge Instructions      Your urine was negative for infection.  Suspect vaginal infection as cause of your symptoms.  Your urine culture and vaginal swab are pending.  We will call if they are positive and treat as appropriate.    ED Prescriptions   None    PDMP not reviewed this encounter.   Teodora Medici, [place] 03/08/21 [phone]

## 2021-03-12 ENCOUNTER — Telehealth (HOSPITAL_COMMUNITY): Payer: Self-pay | Admitting: Emergency Medicine

## 2021-03-12 LAB — URINE CULTURE: Culture: 50000 — AB

## 2021-03-12 LAB — CERVICOVAGINAL ANCILLARY ONLY
Bacterial Vaginitis (gardnerella): NEGATIVE
Candida Glabrata: NEGATIVE
Candida Vaginitis: NEGATIVE
Chlamydia: NEGATIVE
Comment: NEGATIVE
Comment: NEGATIVE
Comment: NEGATIVE
Comment: NEGATIVE
Comment: NEGATIVE
Comment: NORMAL
Neisseria Gonorrhea: NEGATIVE
Trichomonas: NEGATIVE

## 2021-03-12 MED ORDER — NITROFURANTOIN MONOHYD MACRO 100 MG PO CAPS: 100.0000 mg | ORAL_CAPSULE | Freq: Two times a day (BID) | ORAL | 0 refills | Status: DC

## 2021-03-12 MED ORDER — FLUCONAZOLE 150 MG PO TABS: 150.0000 mg | ORAL_TABLET | Freq: Once | ORAL | 0 refills | Status: AC

## 2021-03-14 ENCOUNTER — Ambulatory Visit: Payer: Managed Care, Other (non HMO) | Admitting: Family

## 2021-05-17 ENCOUNTER — Other Ambulatory Visit: Payer: Self-pay | Admitting: Obstetrics and Gynecology

## 2021-08-09 ENCOUNTER — Ambulatory Visit
Admission: RE | Admit: 2021-08-09 | Discharge: 2021-08-09 | Disposition: A | Discharge status: Home / Self Care | Payer: Managed Care, Other (non HMO) | Source: Ambulatory Visit | Attending: Internal Medicine | Admitting: Internal Medicine

## 2021-08-09 ENCOUNTER — Other Ambulatory Visit: Payer: Self-pay

## 2021-08-09 VITALS — BP 140/84 | HR 80 | Temp 98.1°F | Resp 18

## 2021-08-09 DIAGNOSIS — Z113 Encounter for screening for infections with a predominantly sexual mode of transmission: Secondary | ICD-10-CM | POA: Insufficient documentation

## 2021-08-09 DIAGNOSIS — N898 Other specified noninflammatory disorders of vagina: Secondary | ICD-10-CM | POA: Insufficient documentation

## 2021-08-09 NOTE — ED Triage Notes (Signed)
Pt here for STD testing; denies sx

## 2021-08-09 NOTE — Discharge Instructions (Signed)
Your STD tests are pending.  We will call if they are positive and treat as appropriate.  We asked that you refrain from sexual activity until test results and treatment are complete.

## 2021-08-09 NOTE — ED Provider Notes (Signed)
[facility]    CSN: [id number] Arrival date & time: 08/09/21  1745      History   Chief Complaint Chief Complaint  Patient presents with   SEXUALLY TRANSMITTED DISEASE    Entered by patient    HPI Remmy Rory Montel is a 51 y.o. female.   Patient presents for routine STD testing.  Patient reports that she has a new sexual partner so wants to be sure that she does not have an STD.  Denies any confirmed exposure to STD.  Patient reports that she was having vaginal irritation a few days ago but used a boric acid suppository over-the-counter with improvement in symptoms.  Denies any current vaginal discharge, dysuria, urinary frequency, abdominal pain, pelvic pain, back pain, fever.  She no longer has menstrual cycles.  She wants testing for HIV and syphilis as well.     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   Numbness and tingling 01/01/2013   IRREGULAR MENSES 01/01/2010   FOOT PAIN 03/24/2009   PES PLANUS 03/16/2008   HAIR LOSS 02/11/2008   ACUTE CYSTITIS 01/04/2008   DEPRESSION 10/22/2007   MICROSCOPIC HEMATURIA 10/22/2007   BREAST TENDERNESS 10/22/2007   PERIPHERAL EDEMA 05/19/2007   CANDIDIASIS, VAGINAL 03/02/2007   VAGINAL DISCHARGE 03/02/2007   LOSS OF APPETITE 08/30/2006    Past Surgical History:  Procedure Laterality Date   BREAST BIOPSY Left 08/21/2018   Procedure: LEFT BREAST BIOPSY;  Surgeon: Johnathan Hausen, MD;  Location: [place];  Service: General;  Laterality: Left;   CESAREAN SECTION WITH BILATERAL  TUBAL LIGATION  2011   MASTECTOMY, PARTIAL Left 10/16/2018   Procedure: LEFT BREAST PARTIAL MASTECTOMY;  Surgeon: Johnathan Hausen, MD;  Location: [place];  Service: General;  Laterality: Left;   Uterine Ablation      OB History   No obstetric history on file.      Home Medications    Prior to Admission medications   Medication Sig Start Date End Date Taking? Authorizing Provider  acetaminophen (TYLENOL) 325 MG tablet Take 650 mg by mouth every 6 (six) hours as needed.    [provider]  albuterol (VENTOLIN HFA) 108 (90 Base) MCG/ACT inhaler Inhale 1-2 puffs into the lungs every 6 (six) hours as needed for wheezing or shortness of breath. 07/22/19   Ok Edwards, PA-C  clotrimazole (LOTRIMIN) 1 % cream Apply to affected area 2 times daily 07/16/20   Wieters, [employer] C, PA-C  HYDROcodone-acetaminophen (HYCET) 7.5-325 mg/15 ml solution Take 15 mLs by mouth 3 (three) times daily as needed for moderate pain. Patient not taking: Reported on 08/09/2021 02/15/21 02/15/22  Carmin Muskrat, MD  ibuprofen (ADVIL) 400 MG tablet Take 400 mg by mouth every 6 (six) hours as needed.    [provider]  Lidocaine 2 % GEL Apply 1 g topically every 4 (four) hours as needed. 07/16/20   Wieters, Hallie C, PA-C  Methyl Salicylate-Lido-Menthol 4-4-5 % PTCH Apply 1 patch  topically in the morning and at bedtime. 02/15/21   Carmin Muskrat, MD  metroNIDAZOLE (FLAGYL) 500 MG tablet Take 1 tablet (500 mg total) by mouth 2 (two) times daily. Patient not taking: Reported on 08/09/2021 01/24/21   Vanessa Kick, MD  nitrofurantoin, macrocrystal-monohydrate, (MACROBID) 100 MG capsule Take 1 capsule (100 mg total) by mouth 2 (two) times daily. Patient not taking: Reported on 08/09/2021 03/12/21   Chase Picket, MD  NON FORMULARY otc medicine    [provider]  predniSONE (DELTASONE) 20 MG tablet Take 2 tablets (40 mg total) by mouth daily with breakfast. For the next four days Patient  not taking: Reported on 08/09/2021 02/15/21   Carmin Muskrat, MD    Family History Family History  Problem Relation Age of Onset   Healthy Mother    Other Father 46       "murdered"   Breast cancer Maternal Aunt    Prostate cancer Maternal Uncle    Breast cancer Paternal Aunt 20   Breast cancer Maternal Aunt 45   Prostate cancer Maternal Uncle 66       d. 49   Stomach cancer Cousin 87    Social History Social History   Tobacco Use   Smoking status: Never   Smokeless tobacco: Never  Substance Use Topics   Alcohol use: No   Drug use: Never     Allergies   Patient has no known allergies.   Review of Systems Review of Systems Per HPI  Physical Exam Triage Vital Signs ED Triage Vitals [08/09/21 1756]  Enc Vitals Group     BP 140/84     Pulse Rate 80     Resp 18     Temp 98.1 F (36.7 C)     Temp Source Oral     SpO2 99 %     Weight      Height      Head Circumference      Peak Flow      Pain Score 0     Pain Loc      Pain Edu?      Excl. in [place]?    No data found.  Updated Vital Signs BP 140/84 (BP Location: Left Arm)   Pulse 80   Temp 98.1 F (36.7 C) (Oral)   Resp 18   SpO2 99%   Visual Acuity Right Eye Distance:   Left Eye Distance:   Bilateral Distance:    Right Eye Near:   Left Eye Near:    Bilateral Near:     Physical Exam Constitutional:      General: She is not in acute distress.    Appearance: Normal appearance. She is not toxic-appearing or diaphoretic.  HENT:     Head: Normocephalic and atraumatic.  Eyes:     Extraocular Movements: Extraocular movements intact.     Conjunctiva/sclera: Conjunctivae normal.  Pulmonary:     Effort: Pulmonary effort is normal.  Genitourinary:    Comments: Deferred with shared decision-making.  Self swab performed. Neurological:     General: No focal deficit present.     Mental Status: She is alert and oriented to person, place, and time. Mental status is at baseline.  Psychiatric:         Mood and Affect: Mood normal.        Behavior: Behavior normal.        Thought Content: Thought content normal.        Judgment: Judgment normal.  UC Treatments / Results  Labs (all labs ordered are listed, but only abnormal results are displayed) Labs Reviewed  RPR  HIV ANTIBODY (ROUTINE TESTING W REFLEX)  CERVICOVAGINAL ANCILLARY ONLY    EKG   Radiology No results found.  Procedures Procedures (including critical care time)  Medications Ordered in UC Medications - No data to display  Initial Impression / Assessment and Plan / UC Course  I have reviewed the triage vital signs and the nursing notes.  Pertinent labs & imaging results that were available during my care of the patient were reviewed by me and considered in my medical decision making (see chart for details).     Cervicovaginal swab, HIV, RPR test pending.  Will await results for any further treatment.  Advised patient to refrain from sexual activity until test results and treatment are complete.  Discussed return precautions.  Patient verbalized understanding and was agreeable with plan. Final Clinical Impressions(s) / UC Diagnoses   Final diagnoses:  Screening examination for venereal disease     Discharge Instructions      Your STD tests are pending.  We will call if they are positive and treat as appropriate.  We asked that you refrain from sexual activity until test results and treatment are complete.    ED Prescriptions   None    PDMP not reviewed this encounter.   Teodora Medici, [place] 08/09/21 1806

## 2021-08-10 LAB — CERVICOVAGINAL ANCILLARY ONLY
Bacterial Vaginitis (gardnerella): NEGATIVE
Candida Glabrata: NEGATIVE
Candida Vaginitis: NEGATIVE
Chlamydia: NEGATIVE
Comment: NEGATIVE
Comment: NEGATIVE
Comment: NEGATIVE
Comment: NEGATIVE
Comment: NEGATIVE
Comment: NORMAL
Neisseria Gonorrhea: NEGATIVE
Trichomonas: NEGATIVE

## 2021-08-11 LAB — RPR: RPR Ser Ql: NONREACTIVE

## 2021-08-11 LAB — HIV ANTIBODY (ROUTINE TESTING W REFLEX): HIV Screen 4th Generation wRfx: NONREACTIVE

## 2021-11-14 ENCOUNTER — Other Ambulatory Visit: Payer: Self-pay | Admitting: Surgery

## 2021-11-14 DIAGNOSIS — Z1231 Encounter for screening mammogram for malignant neoplasm of breast: Secondary | ICD-10-CM

## 2022-01-07 ENCOUNTER — Ambulatory Visit
Admission: RE | Admit: 2022-01-07 | Discharge: 2022-01-07 | Disposition: A | Discharge status: Home / Self Care | Payer: Managed Care, Other (non HMO) | Source: Ambulatory Visit | Attending: Surgery | Admitting: Surgery

## 2022-01-07 DIAGNOSIS — Z1231 Encounter for screening mammogram for malignant neoplasm of breast: Secondary | ICD-10-CM

## 2022-01-12 ENCOUNTER — Ambulatory Visit
Admission: EM | Admit: 2022-01-12 | Discharge: 2022-01-12 | Disposition: A | Discharge status: Home / Self Care | Payer: Managed Care, Other (non HMO) | Attending: Physician Assistant | Admitting: Physician Assistant

## 2022-01-12 DIAGNOSIS — N898 Other specified noninflammatory disorders of vagina: Secondary | ICD-10-CM | POA: Diagnosis not present

## 2022-01-12 NOTE — ED Triage Notes (Signed)
Pt c/o vaginal irritation, pelvic pain   Denies discharge, odor, frequency, dysuria   Onset ~ Monday. Concerned partner may have sex with someone else.

## 2022-01-12 NOTE — ED Provider Notes (Signed)
[facility]    CSN: [id number] Arrival date & time: 01/12/22  0800      History   Chief Complaint Chief Complaint  Patient presents with   Vaginal Itching    HPI Rhonda Rocha is a 51 y.o. female.   Patient here today for evaluation of vaginal itching and discharge.  She reports that her symptoms feel different than prior yeast infections and states that she did take The Diflucan without resolution which is concerning.  She is not sure that her partner has not been sexually active with other women.  She would like STD screening.  She declines blood work at this time as she just had this recently.  She denies any current genital rashes or lesions.  She has not had any nausea or fever.  The history is provided by the patient.    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   Numbness and tingling 01/01/2013   IRREGULAR MENSES 01/01/2010   FOOT PAIN 03/24/2009   PES PLANUS 03/16/2008   HAIR LOSS 02/11/2008   ACUTE CYSTITIS 01/04/2008   DEPRESSION 10/22/2007   MICROSCOPIC HEMATURIA 10/22/2007   BREAST TENDERNESS 10/22/2007   PERIPHERAL EDEMA 05/19/2007   CANDIDIASIS, VAGINAL 03/02/2007   VAGINAL DISCHARGE 03/02/2007   LOSS OF APPETITE 08/30/2006    Past Surgical History:  Procedure Laterality Date   BREAST BIOPSY Left 08/21/2018   Procedure: LEFT BREAST BIOPSY;  Surgeon: Johnathan Hausen, MD;  Location: [place];  Service: General;  Laterality: Left;   CESAREAN SECTION WITH BILATERAL TUBAL LIGATION  2011   MASTECTOMY,  PARTIAL Left 10/16/2018   Procedure: LEFT BREAST PARTIAL MASTECTOMY;  Surgeon: Johnathan Hausen, MD;  Location: [place];  Service: General;  Laterality: Left;   Uterine Ablation      OB History   No obstetric history on file.      Home Medications    Prior to Admission medications   Medication Sig Start Date End Date Taking? Authorizing Provider  acetaminophen (TYLENOL) 325 MG tablet Take 650 mg by mouth every 6 (six) hours as needed.    [provider]  albuterol (VENTOLIN HFA) 108 (90 Base) MCG/ACT inhaler Inhale 1-2 puffs into the lungs every 6 (six) hours as needed for wheezing or shortness of breath. 07/22/19   Ok Edwards, PA-C  clotrimazole (LOTRIMIN) 1 % cream Apply to affected area 2 times daily 07/16/20   Wieters, [employer] C, PA-C  HYDROcodone-acetaminophen (HYCET) 7.5-325 mg/15 ml solution Take 15 mLs by mouth 3 (three) times daily as needed for moderate pain. Patient not taking: Reported on 08/09/2021 02/15/21 02/15/22  Carmin Muskrat, MD  ibuprofen (ADVIL) 400 MG tablet Take 400 mg by mouth every 6 (six) hours as needed.    [provider]  Lidocaine 2 % GEL Apply 1 g topically every 4 (four) hours as needed. 07/16/20   Wieters, Hallie C, PA-C  Methyl Salicylate-Lido-Menthol 4-4-5 % PTCH Apply 1 patch topically in the  morning and at bedtime. 02/15/21   Carmin Muskrat, MD  metroNIDAZOLE (FLAGYL) 500 MG tablet Take 1 tablet (500 mg total) by mouth 2 (two) times daily. Patient not taking: Reported on 08/09/2021 01/24/21   Vanessa Kick, MD  nitrofurantoin, macrocrystal-monohydrate, (MACROBID) 100 MG capsule Take 1 capsule (100 mg total) by mouth 2 (two) times daily. Patient not taking: Reported on 08/09/2021 03/12/21   Chase Picket, MD  NON FORMULARY otc medicine    [provider]  predniSONE (DELTASONE) 20 MG tablet Take 2 tablets (40 mg total) by mouth daily with breakfast. For the next four days Patient not taking: Reported on 08/09/2021  02/15/21   Carmin Muskrat, MD    Family History Family History  Problem Relation Age of Onset   Healthy Mother    Other Father 18       "murdered"   Breast cancer Maternal Aunt    Breast cancer Maternal Aunt 45   Prostate cancer Maternal Uncle    Prostate cancer Maternal Uncle 29       d. 7   Breast cancer Paternal Aunt 29   Breast cancer Paternal Grandmother    Stomach cancer Cousin 74    Social History Social History   Tobacco Use   Smoking status: Never   Smokeless tobacco: Never  Substance Use Topics   Alcohol use: No   Drug use: Never     Allergies   Patient has no known allergies.   Review of Systems Review of Systems  Constitutional:  Negative for chills and fever.  Eyes:  Negative for discharge and redness.  Gastrointestinal:  Negative for nausea and vomiting.  Genitourinary:  Positive for vaginal discharge. Negative for genital sores.     Physical Exam Triage Vital Signs ED Triage Vitals  Enc Vitals Group     BP      Pulse      Resp      Temp      Temp src      SpO2      Weight      Height      Head Circumference      Peak Flow      Pain Score      Pain Loc      Pain Edu?      Excl. in [place]?    No data found.  Updated Vital Signs BP (!) 156/84 (BP Location: Left Arm)   Pulse 76   Temp 97.7 F (36.5 C) (Oral)   Resp 16   SpO2 98%   Physical Exam Vitals and nursing note reviewed.  Constitutional:      General: She is not in acute distress.    Appearance: Normal appearance. She is not ill-appearing.  HENT:     Head: Normocephalic and atraumatic.  Eyes:     Conjunctiva/sclera: Conjunctivae normal.  Cardiovascular:     Rate and Rhythm: Normal rate.  Pulmonary:     Effort: Pulmonary effort is normal. No respiratory distress.  Neurological:     Mental Status: She is alert.  Psychiatric:        Mood and Affect: Mood normal.        Behavior: Behavior normal.        Thought Content: Thought content normal.      UC  Treatments / Results  Labs (all labs ordered are listed, but only abnormal results are displayed) Labs Reviewed  CERVICOVAGINAL ANCILLARY ONLY    EKG   Radiology No results  found.  Procedures Procedures (including critical care time)  Medications Ordered in UC Medications - No data to display  Initial Impression / Assessment and Plan / UC Course  I have reviewed the triage vital signs and the nursing notes.  Pertinent labs & imaging results that were available during my care of the patient were reviewed by me and considered in my medical decision making (see chart for details).    Screening ordered for gonorrhea, chlamydia, trichomonas, yeast, BV.  Will await results for further recommendation.  Encouraged follow-up sooner with any further concerns.  Final Clinical Impressions(s) / UC Diagnoses   Final diagnoses:  Vaginal discharge   Discharge Instructions   None    ED Prescriptions   None    PDMP not reviewed this encounter.   Francene Finders, PA-C 01/12/22 [phone]

## 2022-01-15 ENCOUNTER — Telehealth (HOSPITAL_COMMUNITY): Payer: Self-pay | Admitting: Emergency Medicine

## 2022-01-15 LAB — CERVICOVAGINAL ANCILLARY ONLY
Bacterial Vaginitis (gardnerella): NEGATIVE
Candida Glabrata: NEGATIVE
Candida Vaginitis: POSITIVE — AB
Chlamydia: NEGATIVE
Comment: NEGATIVE
Comment: NEGATIVE
Comment: NEGATIVE
Comment: NEGATIVE
Comment: NEGATIVE
Comment: NORMAL
Neisseria Gonorrhea: NEGATIVE
Trichomonas: NEGATIVE

## 2022-01-15 MED ORDER — FLUCONAZOLE 150 MG PO TABS: 150.0000 mg | ORAL_TABLET | Freq: Once | ORAL | 0 refills | Status: AC

## 2022-01-30 DIAGNOSIS — U071 COVID-19: Secondary | ICD-10-CM | POA: Insufficient documentation

## 2022-08-20 ENCOUNTER — Other Ambulatory Visit: Payer: Self-pay

## 2022-08-20 ENCOUNTER — Ambulatory Visit
Admission: EM | Admit: 2022-08-20 | Discharge: 2022-08-20 | Disposition: A | Discharge status: Home / Self Care | Payer: Managed Care, Other (non HMO)

## 2022-08-20 DIAGNOSIS — N898 Other specified noninflammatory disorders of vagina: Secondary | ICD-10-CM | POA: Diagnosis present

## 2022-08-20 DIAGNOSIS — N3001 Acute cystitis with hematuria: Secondary | ICD-10-CM | POA: Insufficient documentation

## 2022-08-20 DIAGNOSIS — Z113 Encounter for screening for infections with a predominantly sexual mode of transmission: Secondary | ICD-10-CM | POA: Insufficient documentation

## 2022-08-20 MED ORDER — FLUCONAZOLE 150 MG PO TABS: ORAL_TABLET | ORAL | 0 refills | Status: DC

## 2022-08-20 NOTE — Discharge Instructions (Signed)
Follow up if no gradual improvement or with any further concerns.   See dermatology when able regarding scalp concerns.

## 2022-08-20 NOTE — ED Triage Notes (Signed)
"  I have been having headaches and concerned with area of skin on upper right side of head and hair came out with concerning area of skin". Hairy shedding often since this started happening "since November 2023".   Also having "vaginal discomfort recently" and feeling a "weird sensation". I found a half of Diflucan and this happened some. White discharge still remains.

## 2022-08-20 NOTE — ED Provider Notes (Signed)
[facility]    CSN: [id number] Arrival date & time: 08/20/22  1720      History   Chief Complaint Chief Complaint  Patient presents with   Vaginal Discharge   Headache    HPI Rhonda Rocha is a 52 y.o. female.   Patient here today for evaluation of headaches and concerning skin findings with hair loss.  She states that symptoms have been ongoing since November of last year and she has appointment with dermatology in October of this year.  She also reports vaginal discomfort and white discharge.  She states she did take half of the Diflucan which did help some.  She has not had any other concerning symptoms.  The history is provided by the patient.  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;   Service: General;  Laterality: Left;   CESAREAN SECTION WITH BILATERAL TUBAL LIGATION  2011   MASTECTOMY, PARTIAL Left 10/16/2018   Procedure: LEFT BREAST PARTIAL MASTECTOMY;  Surgeon: Luretha Murphy, MD;  Location: [facility];  Service: General;  Laterality: Left;   Uterine Ablation      OB History   No obstetric history on file.      Home Medications    Prior to Admission medications   Medication Sig Start Date End Date Taking? Authorizing Provider  fluconazole (DIFLUCAN) 150 MG tablet Take one tab PO today and repeat dose in 3 days if symptoms persist 08/20/22  Yes Tomi Bamberger, PA-C  acetaminophen (TYLENOL) 325 MG tablet Take 650 mg by mouth every 6 (six) hours as needed.    [provider]  albuterol (VENTOLIN HFA) 108 (90 Base) MCG/ACT inhaler Inhale 1-2 puffs into the lungs every 6 (six) hours as needed for wheezing or shortness of breath. 07/22/19   Belinda Fisher, PA-C  clotrimazole (LOTRIMIN) 1 % cream Apply to affected area 2 times daily 07/16/20   Wieters, Hallie C, PA-C  ibuprofen (ADVIL) 400 MG tablet Take 400 mg by mouth every 6 (six) hours as needed.    [provider]  Lidocaine 2 % GEL Apply 1 g topically every 4 (four) hours as needed. 07/16/20   Wieters, Junius Creamer, PA-C  Methyl Salicylate-Lido-Menthol  4-4-5 % PTCH Apply 1 patch topically in the morning and at bedtime. 02/15/21   Gerhard Munch, MD  metroNIDAZOLE (FLAGYL) 500 MG tablet Take 1 tablet (500 mg total) by mouth 2 (two) times daily. Patient not taking: Reported on 08/09/2021 01/24/21   Mardella Layman, MD  nitrofurantoin, macrocrystal-monohydrate, (MACROBID) 100 MG capsule Take 1 capsule (100 mg total) by mouth 2 (two) times daily. Patient not taking: Reported on 08/09/2021 03/12/21   Merrilee Jansky, MD  NON FORMULARY otc medicine    [provider]  predniSONE (DELTASONE) 20 MG tablet Take 2 tablets (40 mg total) by mouth daily with breakfast. For the next four days Patient  not taking: Reported on 08/09/2021 02/15/21   Gerhard Munch, MD    Family History Family History  Problem Relation Age of Onset   Healthy Mother    Other Father 68       "murdered"   Breast cancer Maternal Aunt    Breast cancer Maternal Aunt 45   Prostate cancer Maternal Uncle    Prostate cancer Maternal Uncle 75       d. 38   Breast cancer Paternal Aunt 52   Breast cancer Paternal Grandmother    Stomach cancer Cousin 42    Social History Social History   Tobacco Use   Smoking status: Never   Smokeless tobacco: Never  Vaping Use   Vaping status: Never Used  Substance Use Topics   Alcohol use: No   Drug use: Never     Allergies   Patient has no known allergies.   Review of Systems Review of Systems  Constitutional:  Negative for chills and fever.  Eyes:  Negative for discharge and redness.  Respiratory:  Negative for shortness of breath.   Gastrointestinal:  Negative for abdominal pain, nausea and vomiting.  Genitourinary:  Positive for vaginal discharge. Negative for genital sores.  Skin:  Negative for color change and wound.  Neurological:  Positive for headaches.     Physical Exam Triage Vital Signs ED Triage Vitals  Encounter Vitals Group     BP 08/20/22 1833 115/68     Systolic BP Percentile --      Diastolic BP Percentile --      Pulse Rate 08/20/22 1833 70     Resp 08/20/22 1833 18     Temp 08/20/22 1833 98.2 F (36.8 C)     Temp Source 08/20/22 1833 Oral     SpO2 08/20/22 1833 99 %     Weight 08/20/22 1831 174 lb (78.9 kg)     Height 08/20/22 1831 5\' 7"  (1.702 m)     Head Circumference --      Peak Flow --      Pain Score 08/20/22 1831 0     Pain Loc --      Pain Education --      Exclude from Growth Chart --    No data found.  Updated Vital Signs BP 115/68 (BP Location: Left Arm)   Pulse 70   Temp 98.2 F (36.8 C) (Oral)   Resp 18   Ht 5\' 7"  (1.702 m)   Wt 174 lb (78.9 kg)   LMP  (LMP Unknown)   SpO2 99%   BMI 27.25 kg/m      Physical Exam Vitals and nursing note reviewed.  Constitutional:      General: She is not in acute distress.    Appearance: Normal appearance. She is not ill-appearing.  HENT:  Head: Normocephalic and atraumatic.     Nose: Nose normal. No congestion or rhinorrhea.  Eyes:     Conjunctiva/sclera: Conjunctivae normal.  Cardiovascular:     Rate and Rhythm: Normal rate.  Pulmonary:     Effort: Pulmonary effort is normal. No respiratory distress.  Skin:    Comments: No obvious rash or erythema of scalp  Neurological:     Mental Status: She is alert.  Psychiatric:        Mood and Affect: Mood normal.        Behavior: Behavior normal.        Thought Content: Thought content normal.      UC Treatments / Results  Labs (all labs ordered are listed, but only abnormal results are displayed) Labs Reviewed  RPR  HIV ANTIBODY (ROUTINE TESTING W REFLEX)  HEPATITIS PANEL, ACUTE  CERVICOVAGINAL ANCILLARY ONLY    EKG   Radiology No results found.  Procedures Procedures (including critical care time)  Medications Ordered in UC Medications - No data to display  Initial Impression / Assessment and Plan / UC Course  I have reviewed the triage vital signs and the nursing notes.  Pertinent labs & imaging results that were available during my care of the patient were reviewed by me and considered in my medical decision making (see chart for details).    Discussed that there is no obvious infectious cause of hair loss apparent on exam i.e. tinea, and encouraged follow-up with dermatology when possible.  Will order screening for STDs regarding discharge and will treat with another round of Diflucan given some improvement with same.  Encouraged follow-up if no improvement or with any further concerns.  Final Clinical Impressions(s) / UC Diagnoses   Final diagnoses:  Screening for STD (sexually transmitted disease)  Vaginal discharge     Discharge Instructions      Follow  up if no gradual improvement or with any further concerns.   See dermatology when able regarding scalp concerns.    ED Prescriptions     Medication Sig Dispense Auth. Provider   fluconazole (DIFLUCAN) 150 MG tablet Take one tab PO today and repeat dose in 3 days if symptoms persist 2 tablet Tomi Bamberger, PA-C      PDMP not reviewed this encounter.   Tomi Bamberger, PA-C 08/20/22 1924

## 2022-08-21 ENCOUNTER — Ambulatory Visit: Payer: Self-pay

## 2023-03-30 ENCOUNTER — Ambulatory Visit

## 2023-03-31 ENCOUNTER — Ambulatory Visit
Admission: RE | Admit: 2023-03-31 | Discharge: 2023-03-31 | Disposition: A | Discharge status: Home / Self Care | Source: Ambulatory Visit | Attending: Physician Assistant | Admitting: Physician Assistant

## 2023-03-31 VITALS — BP 143/84 | HR 79 | Temp 97.9°F | Resp 18

## 2023-03-31 DIAGNOSIS — B9689 Other specified bacterial agents as the cause of diseases classified elsewhere: Secondary | ICD-10-CM | POA: Insufficient documentation

## 2023-03-31 DIAGNOSIS — M545 Low back pain, unspecified: Secondary | ICD-10-CM | POA: Insufficient documentation

## 2023-03-31 DIAGNOSIS — G5700 Lesion of sciatic nerve, unspecified lower limb: Secondary | ICD-10-CM | POA: Insufficient documentation

## 2023-03-31 DIAGNOSIS — N760 Acute vaginitis: Secondary | ICD-10-CM | POA: Diagnosis present

## 2023-03-31 DIAGNOSIS — N898 Other specified noninflammatory disorders of vagina: Secondary | ICD-10-CM | POA: Diagnosis present

## 2023-03-31 DIAGNOSIS — Z113 Encounter for screening for infections with a predominantly sexual mode of transmission: Secondary | ICD-10-CM | POA: Diagnosis present

## 2023-03-31 NOTE — ED Triage Notes (Signed)
 Pt reports she is taking metronidazole for BV that was prescribed during a video visit last week. She is requesting STI testing because she has reason to believe her partner has been with other people

## 2023-03-31 NOTE — ED Provider Notes (Signed)
 [facility]    CSN: [id number] Arrival date & time: 03/31/23  1736      History   Chief Complaint Chief Complaint  Patient presents with   Vaginal Discharge    Entered by patient    HPI Timaya Georganne Siple is a 53 y.o. female.   Patient here today for STD screening.  She reports she is suspicious that her partner has been sexually active with other people.  She denies any genital lesions, abdominal pain, back pain.  She has been having discharge and was recently treated for BV with metronidazole which she continues to take.  She denies any dysuria, urinary frequency or urgency.  The history is provided by the patient.  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   Seasonal allergies 05/25/2018   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   Numbness and tingling 01/01/2013   IRREGULAR MENSES 01/01/2010   Irregular menses 01/01/2010   FOOT PAIN 03/24/2009   Pes planus 03/16/2008   Alopecia 02/11/2008   Hair loss 02/11/2008   ACUTE CYSTITIS 01/04/2008   DEPRESSION 10/22/2007   MICROSCOPIC HEMATURIA 10/22/2007   BREAST TENDERNESS 10/22/2007   Depression 10/22/2007   PERIPHERAL EDEMA 05/19/2007   CANDIDIASIS, VAGINAL 03/02/2007   Vaginal leukorrhea 03/02/2007   Candidal vulvovaginitis 03/02/2007   LOSS OF APPETITE 08/30/2006    Past Surgical History:  Procedure  Laterality Date   BREAST BIOPSY Left 08/21/2018   Procedure: LEFT BREAST BIOPSY;  Surgeon: Luretha Murphy, MD;  Location: [facility];  Service: General;  Laterality: Left;   CESAREAN SECTION WITH BILATERAL TUBAL LIGATION  2011   MASTECTOMY, PARTIAL Left 10/16/2018   Procedure: LEFT BREAST PARTIAL MASTECTOMY;  Surgeon: Luretha Murphy, MD;  Location: [facility];  Service: General;  Laterality: Left;   Uterine Ablation      OB History   No obstetric history on file.      Home Medications    Prior to Admission medications   Medication Sig Start Date End Date Taking? Authorizing Provider  acetaminophen (TYLENOL) 325 MG tablet Take 650 mg by mouth every 6 (six) hours as needed.    [provider]  albuterol (VENTOLIN HFA) 108 (90 Base) MCG/ACT inhaler Inhale 1-2 puffs into the lungs every 6 (six) hours as needed for wheezing or shortness of breath. 07/22/19   Belinda Fisher, PA-C  clotrimazole (LOTRIMIN) 1 % cream Apply to affected area 2 times daily 07/16/20   Wieters, [place] C, PA-C  fluconazole (DIFLUCAN) 150 MG tablet Take one tab PO today and repeat dose in 3 days if symptoms persist 08/20/22   Tomi Bamberger, PA-C  ibuprofen (ADVIL) 400 MG tablet Take 400 mg by mouth every 6 (six) hours as needed.    [provider]  Lidocaine  2 % GEL Apply 1 g topically every 4 (four) hours as needed. 07/16/20   Wieters, Hallie C, PA-C  Methyl Salicylate-Lido-Menthol 4-4-5 % PTCH Apply 1 patch topically in the morning and at bedtime. 02/15/21   Gerhard Munch, MD  metroNIDAZOLE (FLAGYL) 500 MG tablet Take 1 tablet (500 mg total) by mouth 2 (two) times daily. Patient not taking: Reported on 08/09/2021 01/24/21   Mardella Layman, MD  nitrofurantoin, macrocrystal-monohydrate, (MACROBID) 100 MG capsule Take 1 capsule (100 mg total) by mouth 2 (two) times daily. Patient not taking: Reported on 08/09/2021 03/12/21   Merrilee Jansky, MD  NON FORMULARY otc medicine     [provider]  predniSONE (DELTASONE) 20 MG tablet Take 2 tablets (40 mg total) by mouth daily with breakfast. For the next four days Patient not taking: Reported on 08/09/2021 02/15/21   Gerhard Munch, MD    Family History Family History  Problem Relation Age of Onset   Healthy Mother    Other Father 11       "murdered"   Breast cancer Maternal Aunt    Breast cancer Maternal Aunt 45   Prostate cancer Maternal Uncle    Prostate cancer Maternal Uncle 47       d. 82   Breast cancer Paternal Aunt 53   Breast cancer Paternal Grandmother    Stomach cancer Cousin 83    Social History Social History   Tobacco Use   Smoking status: Never   Smokeless tobacco: Never  Vaping Use   Vaping status: Never Used  Substance Use Topics   Alcohol use: No   Drug use: Never     Allergies   Patient has no known allergies.   Review of Systems Review of Systems  Constitutional:  Negative for chills and fever.  Eyes:  Negative for discharge and redness.  Gastrointestinal:  Negative for abdominal pain, nausea and vomiting.  Genitourinary:  Positive for vaginal discharge. Negative for dysuria, frequency and genital sores.     Physical Exam Triage Vital Signs ED Triage Vitals  Encounter Vitals Group     BP      Systolic BP Percentile      Diastolic BP Percentile      Pulse      Resp      Temp      Temp src      SpO2      Weight      Height      Head Circumference      Peak Flow      Pain Score      Pain Loc      Pain Education      Exclude from Growth Chart    No data found.  Updated Vital Signs BP (!) 143/84 (BP Location: Left Arm)   Pulse 79   Temp 97.9 F (36.6 C) (Oral)   Resp 18   SpO2 98%   Visual Acuity Right Eye Distance:   Left Eye Distance:   Bilateral Distance:    Right Eye Near:   Left Eye Near:    Bilateral Near:     Physical Exam Vitals and nursing note reviewed.  Constitutional:      General: She is not in acute distress.     Appearance: Normal appearance. She is not ill-appearing.  HENT:     Head: Normocephalic and atraumatic.  Eyes:     Conjunctiva/sclera: Conjunctivae normal.  Cardiovascular:     Rate and Rhythm: Normal rate.  Pulmonary:     Effort: Pulmonary effort is normal. No respiratory distress.  Neurological:     Mental Status: She is alert.  Psychiatric:        Mood and Affect: Mood normal.        Behavior: Behavior normal.        Thought Content: Thought content normal.      UC Treatments / Results  Labs (all labs ordered are listed, but only abnormal results are displayed) Labs Reviewed  RPR  HIV ANTIBODY (ROUTINE TESTING W REFLEX)  CERVICOVAGINAL ANCILLARY ONLY    EKG   Radiology No results found.  Procedures Procedures (including critical care time)  Medications Ordered in UC Medications - No data to display  Initial Impression / Assessment and Plan / UC Course  I have reviewed the triage vital signs and the nursing notes.  Pertinent labs & imaging results that were available during my care of the patient were reviewed by me and considered in my medical decision making (see chart for details).    STD screening ordered as requested as well as BV and yeast screening.  Will await results for further recommendation and encouraged follow-up with any further concerns.  Final Clinical Impressions(s) / UC Diagnoses   Final diagnoses:  Screening for STD (sexually transmitted disease)   Discharge Instructions   None    ED Prescriptions   None    PDMP not reviewed this encounter.   Tomi Bamberger, PA-C 03/31/23 [phone]

## 2023-04-01 LAB — CERVICOVAGINAL ANCILLARY ONLY
Bacterial Vaginitis (gardnerella): NEGATIVE
Candida Glabrata: NEGATIVE
Candida Vaginitis: NEGATIVE
Chlamydia: NEGATIVE
Comment: NEGATIVE
Comment: NEGATIVE
Comment: NEGATIVE
Comment: NEGATIVE
Comment: NEGATIVE
Comment: NORMAL
Neisseria Gonorrhea: NEGATIVE
Trichomonas: NEGATIVE

## 2023-04-02 LAB — HIV ANTIBODY (ROUTINE TESTING W REFLEX): HIV Screen 4th Generation wRfx: NONREACTIVE

## 2023-04-02 LAB — RPR: RPR Ser Ql: NONREACTIVE

## 2023-09-01 ENCOUNTER — Ambulatory Visit
Admission: EM | Admit: 2023-09-01 | Discharge: 2023-09-01 | Disposition: A | Discharge status: Home / Self Care | Attending: Emergency Medicine | Admitting: Emergency Medicine

## 2023-09-01 DIAGNOSIS — Z113 Encounter for screening for infections with a predominantly sexual mode of transmission: Secondary | ICD-10-CM | POA: Insufficient documentation

## 2023-09-01 NOTE — ED Triage Notes (Signed)
 I have a new man and would like STI testing done, no symptoms but I might have a yeast infection due to using a new toy.

## 2023-09-01 NOTE — ED Provider Notes (Signed)
 HPI  SUBJECTIVE:  Rhonda Rocha is a 53 y.o. female who presents for STD screening.  She is here today with her new female partner, who is also here today for screening.  He is asymptomatic.  She states that they are in a monogamous relationship.  She reports mild clitoral irritation starting today after using a sex toy last night.  She denies vaginal itching, discharge, odor, genital rash urinary complaints, nausea, vomiting, fevers, abdominal, back, pelvic pain.  No aggravating or alleviating factors.  She has not tried anything for symptoms.  She has a past medical history of frequent BV and yeast, breast cancer and is status post bilateral tubal ligation.  No history of STDs.  LMP 1.5 to 2 weeks ago.  She has an IUD.  Denies possibility being pregnant.  [facility]   Past Medical History:  Diagnosis Date   Cancer [facility])    Family history of breast cancer    Family history of prostate cancer    Family history of stomach cancer    Personal history of radiation therapy     Past Surgical History:  Procedure Laterality Date   BREAST BIOPSY Left 08/21/2018   Procedure: LEFT BREAST BIOPSY;  Surgeon: Gladis Cough, MD;  Location: [facility];  Service: General;  Laterality: Left;   CESAREAN SECTION WITH BILATERAL TUBAL LIGATION  2011   MASTECTOMY, PARTIAL Left 10/16/2018   Procedure: LEFT BREAST PARTIAL MASTECTOMY;  Surgeon: Gladis Cough, MD;  Location: [facility];  Service: General;  Laterality: Left;   Uterine Ablation      Family History  Problem Relation Age of Onset   Healthy Mother    Other Father 31       murdered   Breast cancer Maternal Aunt    Breast cancer Maternal Aunt 45   Prostate cancer Maternal Uncle    Prostate cancer Maternal Uncle 74       d. 58   Breast cancer Paternal Aunt 45   Breast cancer Paternal Grandmother    Stomach cancer Cousin 56    Social History   Tobacco Use   Smoking status: Never    Passive  exposure: Never   Smokeless tobacco: Never  Vaping Use   Vaping status: Never Used  Substance Use Topics   Alcohol use: No   Drug use: Never    No current facility-administered medications for this encounter.  Current Outpatient Medications:    chlorhexidine  (PERIDEX ) 0.12 % solution, Use as directed 15 mLs in the mouth or throat as directed., Disp: , Rfl:    cyclobenzaprine (FLEXERIL) 10 MG tablet, Take 1 tablet by mouth daily., Disp: , Rfl:    cyproheptadine (PERIACTIN) 4 MG tablet, Take 4 mg by mouth 3 (three) times daily., Disp: , Rfl:    fluconazole  (DIFLUCAN ) 100 MG tablet, Take 100 mg by mouth once., Disp: , Rfl:    Fluocinolone Acetonide Scalp 0.01 % OIL, Massage in scalp every other day., Disp: , Rfl:    gabapentin  (NEURONTIN ) 300 MG capsule, Take 300 mg by mouth 3 (three) times daily., Disp: , Rfl:    ketoconazole (NIZORAL) 2 % cream, Apply 1 Application topically as directed., Disp: , Rfl:    levonorgestrel (MIRENA) 20 MCG/DAY IUD, 1 each by Intrauterine route once., Disp: , Rfl:    naproxen (NAPROSYN) 500 MG tablet, Take 500 mg by mouth 2 (two) times daily with a meal., Disp: , Rfl:    SODIUM FLUORIDE, DENTAL RINSE, 0.2 %  SOLN, USE AS AN ORAL RINSE, AS DIRECTED ON PACKAGE, Disp: , Rfl:    acetaminophen  (TYLENOL ) 325 MG tablet, Take 650 mg by mouth every 6 (six) hours as needed., Disp: , Rfl:    albuterol  (VENTOLIN  HFA) 108 (90 Base) MCG/ACT inhaler, Inhale 1-2 puffs into the lungs every 6 (six) hours as needed for wheezing or shortness of breath., Disp: 8 g, Rfl: 0   clotrimazole  (LOTRIMIN ) 1 % cream, Apply to affected area 2 times daily, Disp: 15 g, Rfl: 0   fluconazole  (DIFLUCAN ) 150 MG tablet, Take one tab PO today and repeat dose in 3 days if symptoms persist, Disp: 2 tablet, Rfl: 0   fluconazole  (DIFLUCAN ) 150 MG tablet, Take 150 mg by mouth once., Disp: , Rfl:    ibuprofen  (ADVIL ) 400 MG tablet, Take 400 mg by mouth every 6 (six) hours as needed., Disp: , Rfl:     Lidocaine  2 % GEL, Apply 1 g topically every 4 (four) hours as needed., Disp: 28.33 g, Rfl: 0   Methyl Salicylate -Lido-Menthol  4-4-5 % PTCH, Apply 1 patch topically in the morning and at bedtime., Disp: 30 patch, Rfl: 0   metroNIDAZOLE  (FLAGYL ) 500 MG tablet, Take 1 tablet (500 mg total) by mouth 2 (two) times daily. (Patient not taking: Reported on 08/09/2021), Disp: 14 tablet, Rfl: 0   nitrofurantoin , macrocrystal-monohydrate, (MACROBID ) 100 MG capsule, Take 1 capsule (100 mg total) by mouth 2 (two) times daily. (Patient not taking: Reported on 08/09/2021), Disp: 10 capsule, Rfl: 0   NON FORMULARY, otc medicine, Disp: , Rfl:    predniSONE  (DELTASONE ) 20 MG tablet, Take 2 tablets (40 mg total) by mouth daily with breakfast. For the next four days (Patient not taking: Reported on 08/09/2021), Disp: 8 tablet, Rfl: 0  No Known Allergies   ROS  As noted in HPI.   Physical Exam  BP 130/85 (BP Location: Left Arm)   Pulse 69   Temp 98.6 F (37 C) (Oral)   Resp 18   Ht 5' 7 (1.702 m)   Wt 79.4 kg   LMP  (Exact Date)   SpO2 98%   BMI 27.41 kg/m   Constitutional: Well developed, well nourished, no acute distress Eyes:  EOMI, conjunctiva normal bilaterally HENT: Normocephalic, atraumatic,mucus membranes moist Respiratory: Normal inspiratory effort Cardiovascular: Normal rate GI: nondistended.  No suprapubic tenderness. skin: No rash, skin intact Musculoskeletal: no deformities Neurologic: Alert & oriented x 3, no focal neuro deficits Psychiatric: Speech and behavior appropriate   ED Course   Medications - No data to display  Orders Placed This Encounter  Procedures   HIV Antibody (routine testing w rflx)    Standing Status:   Standing    Number of Occurrences:   1   RPR    Standing Status:   Standing    Number of Occurrences:   1    No results found for this or any previous visit (from the past 24 hours). No results found.  ED Clinical Impression  1. Screening for STDs  (sexually transmitted diseases)      ED Assessment/Plan    Patient presents for STD screening.  Will send off swab for gonorrhea, chlamydia, trichomonas, but also for BV and yeast due to the clitoral irritation.  She states she gets very frequent yeast and BV infections and if it is present, she is concerned that she will develop symptoms.  She would like to be treated if BV or yeast come back positive.  Will also check HIV  and RPR.  Follow-up with PCP as needed.  Discussed labs,  MDM, treatment plan, and plan for follow-up with patient.  patient agrees with plan.   No orders of the defined types were placed in this encounter.     *This clinic note was created using Dragon dictation software. Therefore, there may be occasional mistakes despite careful proofreading.  ?    Van Knee, MD 09/03/23 [phone]

## 2023-09-01 NOTE — Discharge Instructions (Signed)
 Give us  a working phone number so that we can contact you if needed. Refrain from sexual contact until all of your labs have come back, symptoms have resolved, and you and your partner(s) are treated if necessary.  Go to www.goodrx.com  or www.[email or web address] to look up your medications. This will give you a list of where you can find your prescriptions at the most affordable prices. Or ask the pharmacist what the cash price is, or if they have any other discount programs available to help make your medication more affordable. This can be less expensive than what you would pay with insurance.

## 2023-09-02 LAB — RPR: RPR Ser Ql: NONREACTIVE

## 2023-09-02 LAB — HIV ANTIBODY (ROUTINE TESTING W REFLEX): HIV Screen 4th Generation wRfx: NONREACTIVE

## 2023-09-03 LAB — CERVICOVAGINAL ANCILLARY ONLY
Candida Glabrata: NEGATIVE
Candida Vaginitis: NEGATIVE
Chlamydia: NEGATIVE
Comment: NEGATIVE
Comment: NEGATIVE
Comment: NEGATIVE
Comment: NEGATIVE
Comment: NORMAL
Neisseria Gonorrhea: NEGATIVE
Trichomonas: NEGATIVE

## 2023-12-04 ENCOUNTER — Ambulatory Visit
Admission: EM | Admit: 2023-12-04 | Discharge: 2023-12-04 | Disposition: A | Discharge status: Home / Self Care | Attending: Family Medicine | Admitting: Family Medicine

## 2023-12-04 ENCOUNTER — Ambulatory Visit (INDEPENDENT_AMBULATORY_CARE_PROVIDER_SITE_OTHER)

## 2023-12-04 ENCOUNTER — Other Ambulatory Visit: Payer: Self-pay

## 2023-12-04 ENCOUNTER — Encounter: Payer: Self-pay | Admitting: Emergency Medicine

## 2023-12-04 DIAGNOSIS — R002 Palpitations: Secondary | ICD-10-CM | POA: Diagnosis not present

## 2023-12-04 DIAGNOSIS — R072 Precordial pain: Secondary | ICD-10-CM | POA: Diagnosis not present

## 2023-12-04 MED ORDER — IBUPROFEN 600 MG PO TABS: 600.0000 mg | ORAL_TABLET | Freq: Three times a day (TID) | ORAL | 0 refills | Status: AC | PRN

## 2023-12-04 NOTE — ED Provider Notes (Signed)
 [facility]    CSN: [id number] Arrival date & time: 12/04/23  1730      History   Chief Complaint Chief Complaint  Patient presents with   chest discomfort     HPI Rhonda Rocha is a 53 y.o. female.   HPI  Here for fluttering feeling in her chest and palpitations and bilateral lower rib pain.  Symptoms began about 2 weeks ago.  The fluttering abnormal feeling in her chest can last all day.  The lower rib pain is intermittent and can come and go.  She does feel like sometimes when she raises her arms overhead it helped  No nausea or vomiting or diarrhea and no fever or chills or cough or congestion.  She has felt warm and cold, stating she feels like she is having hot flashes.  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   Seasonal allergies 05/25/2018   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   Numbness and tingling 01/01/2013   IRREGULAR MENSES 01/01/2010   Irregular menses 01/01/2010   FOOT PAIN 03/24/2009   Pes planus 03/16/2008   Alopecia 02/11/2008   Hair loss 02/11/2008   ACUTE CYSTITIS 01/04/2008   DEPRESSION 10/22/2007   MICROSCOPIC HEMATURIA 10/22/2007   BREAST TENDERNESS 10/22/2007   Depression 10/22/2007   PERIPHERAL EDEMA 05/19/2007   CANDIDIASIS, VAGINAL 03/02/2007   Vaginal leukorrhea 03/02/2007   Candidal vulvovaginitis 03/02/2007   LOSS OF APPETITE 08/30/2006    Past Surgical History:  Procedure  Laterality Date   BREAST BIOPSY Left 08/21/2018   Procedure: LEFT BREAST BIOPSY;  Surgeon: Gladis Cough, MD;  Location: [facility];  Service: General;  Laterality: Left;   CESAREAN SECTION WITH BILATERAL TUBAL LIGATION  2011   MASTECTOMY, PARTIAL Left 10/16/2018   Procedure: LEFT BREAST PARTIAL MASTECTOMY;  Surgeon: Gladis Cough, MD;  Location: [facility];  Service: General;  Laterality: Left;   Uterine Ablation      OB History     Gravida  8   Para  6   Term  6   Preterm      AB  1   Living  6      SAB  1   IAB      Ectopic      Multiple      Live Births  6            Home Medications    Prior to Admission medications   Medication Sig Start Date End Date Taking? Authorizing Provider  cyproheptadine (PERIACTIN) 4 MG tablet Take 4 mg by mouth 3 (three) times daily. 11/25/22  Yes [provider]  ibuprofen  (ADVIL ) 600 MG tablet Take 1 tablet (600 mg total) by mouth every 8 (eight) hours as needed (pain). 12/04/23  Yes Armstrong Creasy K, MD  levonorgestrel (MIRENA) 20 MCG/DAY IUD  1 each by Intrauterine route once.   Yes [provider]  acetaminophen  (TYLENOL ) 325 MG tablet Take 650 mg by mouth every 6 (six) hours as needed.    [provider]  clotrimazole  (LOTRIMIN ) 1 % cream Apply to affected area 2 times daily Patient not taking: Reported on 12/04/2023 07/16/20   Wieters, Hallie C, PA-C  cyclobenzaprine (FLEXERIL) 10 MG tablet Take 1 tablet by mouth daily. Patient not taking: Reported on 12/04/2023 05/27/19   [provider]  fluconazole  (DIFLUCAN ) 150 MG tablet Take 150 mg by mouth once. Patient not taking: Reported on 12/04/2023    [provider]  Fluocinolone Acetonide Scalp 0.01 % OIL Massage in scalp every other day. Patient not taking: Reported on 12/04/2023 10/24/22   [provider]  gabapentin  (NEURONTIN ) 300 MG capsule Take 300 mg by mouth 3 (three) times  daily. Patient not taking: Reported on 12/04/2023 10/23/22   [provider]  ketoconazole (NIZORAL) 2 % cream Apply 1 Application topically as directed. Patient not taking: Reported on 12/04/2023 10/24/22   [provider]  Lidocaine  2 % GEL Apply 1 g topically every 4 (four) hours as needed. Patient not taking: Reported on 12/04/2023 07/16/20   Wieters, Hallie C, PA-C  Methyl Salicylate -Lido-Menthol  4-4-5 % PTCH Apply 1 patch topically in the morning and at bedtime. Patient not taking: Reported on 12/04/2023 02/15/21   Garrick Charleston, MD  NON [facility] otc medicine Patient not taking: Reported on 12/04/2023    [provider]  SODIUM FLUORIDE, DENTAL RINSE, 0.2 % SOLN USE AS AN ORAL RINSE, AS DIRECTED ON PACKAGE Patient not taking: Reported on 12/04/2023 07/04/22   [provider]    Family History Family History  Problem Relation Age of Onset   Healthy Mother    Other Father 61       murdered   Breast cancer Maternal Aunt    Breast cancer Maternal Aunt 45   Prostate cancer Maternal Uncle    Prostate cancer Maternal Uncle 13       d. 33   Breast cancer Paternal Aunt 63   Breast cancer Paternal Grandmother    Stomach cancer Cousin 21    Social History Social History   Tobacco Use   Smoking status: Never    Passive exposure: Never   Smokeless tobacco: Never  Vaping Use   Vaping status: Never Used  Substance Use Topics   Alcohol use: No   Drug use: Never     Allergies   Patient has no known allergies.   Review of Systems Review of Systems   Physical Exam Triage Vital Signs ED Triage Vitals  Encounter Vitals Group     BP 12/04/23 1742 118/80     Girls Systolic BP Percentile --      Girls Diastolic BP Percentile --      Boys Systolic BP Percentile --      Boys Diastolic BP Percentile --      Pulse Rate 12/04/23 1742 79     Resp 12/04/23 1742 14     Temp 12/04/23 1742 (!) 97.5 F (36.4 C)     Temp Source 12/04/23 1742  Oral     SpO2 12/04/23 1742 98 %     Weight --      Height --      Head Circumference --      Peak Flow --      Pain Score 12/04/23 1743 0     Pain Loc --  Pain Education --      Exclude from Growth Chart --    No data found.  Updated Vital Signs BP 118/80 (BP Location: Left Arm)   Pulse 79   Temp (!) 97.5 F (36.4 C) (Oral)   Resp 14   LMP  (LMP Unknown)   SpO2 98%   Visual Acuity Right Eye Distance:   Left Eye Distance:   Bilateral Distance:    Right Eye Near:   Left Eye Near:    Bilateral Near:     Physical Exam Vitals reviewed.  Constitutional:      General: She is not in acute distress.    Appearance: She is not ill-appearing, toxic-appearing or diaphoretic.  HENT:     Nose: Nose normal.     Mouth/Throat:     Mouth: Mucous membranes are moist.     Pharynx: No oropharyngeal exudate or posterior oropharyngeal erythema.  Eyes:     Extraocular Movements: Extraocular movements intact.     Conjunctiva/sclera: Conjunctivae normal.     Pupils: Pupils are equal, round, and reactive to light.  Cardiovascular:     Rate and Rhythm: Normal rate and regular rhythm.     Heart sounds: No murmur heard. Pulmonary:     Effort: Pulmonary effort is normal. No respiratory distress.     Breath sounds: No stridor. No wheezing, rhonchi or rales.  Chest:     Chest wall: No tenderness.  Musculoskeletal:     Cervical back: Neck supple.  Lymphadenopathy:     Cervical: No cervical adenopathy.  Skin:    Capillary Refill: Capillary refill takes less than 2 seconds.     Coloration: Skin is not jaundiced or pale.  Neurological:     General: No focal deficit present.     Mental Status: She is alert and oriented to person, place, and time.  Psychiatric:        Behavior: Behavior normal.      UC Treatments / Results  Labs (all labs ordered are listed, but only abnormal results are displayed) Labs Reviewed  CBC  COMPREHENSIVE METABOLIC PANEL WITH GFR  TSH     EKG   Radiology DG Chest 2 View Result Date: 12/04/2023 EXAM: 2 VIEW(S) XRAY OF THE CHEST 12/04/2023 06:30:27 PM COMPARISON: 07/22/2023 CLINICAL HISTORY: palpitations, bilateral lower rib cage pain FINDINGS: LUNGS AND PLEURA: No focal pulmonary opacity. No pleural effusion. No pneumothorax. HEART AND MEDIASTINUM: No acute abnormality of the cardiac and mediastinal silhouettes. BONES AND SOFT TISSUES: No acute osseous abnormality. IMPRESSION: 1. No acute cardiopulmonary abnormality. Electronically signed by: Rogelia Myers MD 12/04/2023 06:46 PM EST RP Workstation: [id number]    Procedures Procedures (including critical care time)  Medications Ordered in UC Medications - No data to display  Initial Impression / Assessment and Plan / UC Course  I have reviewed the triage vital signs and the nursing notes.  Pertinent labs & imaging results that were available during my care of the patient were reviewed by me and considered in my medical decision making (see chart for details).     EKG does not show any significant abnormality.  There is sinus rhythm with first AV block.  Heart rate at the time of EKG tracing is 68  Chest x-ray is normal  CBC, CMP, and TSH are drawn to begin evaluating her symptoms.  Staff will notify her if there is anything significantly abnormal.  She will follow-up with her primary care.  Ibuprofen  is sent in for the pain. Final Clinical  Impressions(s) / UC Diagnoses   Final diagnoses:  Precordial pain  Palpitations     Discharge Instructions      The EKG does not show a cause for your symptoms  The chest x-ray is read as normal  We have drawn blood to check blood counts, electrolytes and sugar and kidney and liver function and thyroid function.  Staff will notify you if there is anything significantly abnormal  Take ibuprofen  600 mg--1 tab every 8 hours as needed for pain.  If you worsen in any way, please go to the emergency room  Please  follow-up with your primary care      ED Prescriptions     Medication Sig Dispense Auth. Provider   ibuprofen  (ADVIL ) 600 MG tablet Take 1 tablet (600 mg total) by mouth every 8 (eight) hours as needed (pain). 15 tablet Jenyfer Trawick K, MD      PDMP not reviewed this encounter.   Vonna Sharlet POUR, MD 12/04/23 [phone]

## 2023-12-04 NOTE — ED Triage Notes (Addendum)
 Pt reports chest discomfort x2 weeks. Pt reports intermittent squeezing and stabbing sensations along her ribcage underneath both breasts. States she has to raise her arms above her head for pain to subside. Also reports a jitteriness like butterflies in mid-chest x2 weeks that is more consistent. Last episode of the stabbing pain around ribcage was yesterday. Denies SOB. No med use for symptoms. No recent cold symptoms. Reports hot flashes throughout this period as well.

## 2023-12-04 NOTE — Discharge Instructions (Signed)
 The EKG does not show a cause for your symptoms  The chest x-ray is read as normal  We have drawn blood to check blood counts, electrolytes and sugar and kidney and liver function and thyroid function.  Staff will notify you if there is anything significantly abnormal  Take ibuprofen  600 mg--1 tab every 8 hours as needed for pain.  If you worsen in any way, please go to the emergency room  Please follow-up with your primary care

## 2023-12-05 ENCOUNTER — Ambulatory Visit (HOSPITAL_COMMUNITY): Payer: Self-pay

## 2023-12-05 LAB — COMPREHENSIVE METABOLIC PANEL WITH GFR
ALT: 13 IU/L (ref 0–32)
AST: 17 IU/L (ref 0–40)
Albumin: 4.1 g/dL (ref 3.9–4.9)
Alkaline Phosphatase: 69 IU/L (ref 41–116)
BUN/Creatinine Ratio: 17 (ref 9–23)
BUN: 14 mg/dL (ref 6–24)
Bilirubin Total: 0.2 mg/dL (ref 0.0–1.2)
CO2: 24 mmol/L (ref 20–29)
Calcium: 9.4 mg/dL (ref 8.7–10.2)
Chloride: 102 mmol/L (ref 96–106)
Creatinine, Ser: 0.81 mg/dL (ref 0.57–1.00)
Globulin, Total: 2.5 g/dL (ref 1.5–4.5)
Glucose: 86 mg/dL (ref 70–99)
Potassium: 3.9 mmol/L (ref 3.5–5.2)
Sodium: 139 mmol/L (ref 134–144)
Total Protein: 6.6 g/dL (ref 6.0–8.5)
eGFR: 88 mL/min/1.73 (ref >59)

## 2023-12-05 LAB — CBC
Hematocrit: 41.2 % (ref 34.0–46.6)
Hemoglobin: 13.9 g/dL (ref 11.1–15.9)
MCH: 30.1 pg (ref 26.6–33.0)
MCHC: 33.7 g/dL (ref 31.5–35.7)
MCV: 89 fL (ref 79–97)
Platelets: 260 x10E3/uL (ref 150–450)
RBC: 4.62 x10E6/uL (ref 3.77–5.28)
RDW: 13 % (ref 11.7–15.4)
WBC: 4.2 x10E3/uL (ref 3.4–10.8)

## 2023-12-05 LAB — TSH: TSH: 1.77 u[IU]/mL (ref 0.450–4.500)

## 2024-02-09 ENCOUNTER — Telehealth: Payer: Self-pay | Admitting: Orthopedic Surgery

## 2024-02-09 NOTE — Telephone Encounter (Signed)
 CALLED PT LEFT VM THAT PROVIDER WILL NOT BE IN OFFICE 1/29 NOR 2/2. PT NEED TO R/S/

## 2024-02-19 ENCOUNTER — Ambulatory Visit: Admitting: Orthopedic Surgery
